# Patient Record
Sex: Female | Born: 1955 | Race: Black or African American | Hispanic: No | Marital: Single | Smoking: Never smoker
Health system: Southern US, Community
[De-identification: ages and names within clinical notes are randomized; demographics above are authoritative.]

## PROBLEM LIST (undated history)

## (undated) DIAGNOSIS — M1990 Unspecified osteoarthritis, unspecified site: Secondary | ICD-10-CM

## (undated) DIAGNOSIS — E785 Hyperlipidemia, unspecified: Secondary | ICD-10-CM

## (undated) DIAGNOSIS — M545 Low back pain, unspecified: Secondary | ICD-10-CM

## (undated) DIAGNOSIS — D649 Anemia, unspecified: Secondary | ICD-10-CM

## (undated) DIAGNOSIS — E669 Obesity, unspecified: Secondary | ICD-10-CM

## (undated) DIAGNOSIS — E119 Type 2 diabetes mellitus without complications: Secondary | ICD-10-CM

## (undated) DIAGNOSIS — M7989 Other specified soft tissue disorders: Secondary | ICD-10-CM

## (undated) DIAGNOSIS — T7840XA Allergy, unspecified, initial encounter: Secondary | ICD-10-CM

## (undated) HISTORY — DX: Low back pain, unspecified: M54.50

## (undated) HISTORY — DX: Allergy, unspecified, initial encounter: T78.40XA

## (undated) HISTORY — PX: BREAST SURGERY: SHX581

## (undated) HISTORY — DX: Unspecified osteoarthritis, unspecified site: M19.90

## (undated) HISTORY — PX: ABDOMINAL HYSTERECTOMY: SHX81

## (undated) HISTORY — PX: MYOMECTOMY: SHX85

## (undated) HISTORY — DX: Obesity, unspecified: E66.9

## (undated) HISTORY — DX: Hyperlipidemia, unspecified: E78.5

## (undated) HISTORY — DX: Low back pain: M54.5

## (undated) HISTORY — DX: Anemia, unspecified: D64.9

---

## 1998-10-06 ENCOUNTER — Ambulatory Visit (HOSPITAL_COMMUNITY): Admission: RE | Admit: 1998-10-06 | Discharge: 1998-10-06 | Payer: Self-pay | Admitting: Obstetrics and Gynecology

## 1999-11-14 ENCOUNTER — Other Ambulatory Visit: Admission: RE | Admit: 1999-11-14 | Discharge: 1999-11-14 | Payer: Self-pay | Admitting: Obstetrics and Gynecology

## 1999-11-20 ENCOUNTER — Encounter (INDEPENDENT_AMBULATORY_CARE_PROVIDER_SITE_OTHER): Payer: Self-pay | Admitting: Specialist

## 1999-11-20 ENCOUNTER — Ambulatory Visit (HOSPITAL_COMMUNITY): Admission: RE | Admit: 1999-11-20 | Discharge: 1999-11-20 | Payer: Self-pay | Admitting: Obstetrics and Gynecology

## 2000-04-15 ENCOUNTER — Encounter: Admission: RE | Admit: 2000-04-15 | Discharge: 2000-04-15 | Payer: Self-pay | Admitting: Family Medicine

## 2000-04-15 ENCOUNTER — Encounter: Payer: Self-pay | Admitting: Family Medicine

## 2000-10-13 ENCOUNTER — Other Ambulatory Visit: Admission: RE | Admit: 2000-10-13 | Discharge: 2000-10-13 | Payer: Self-pay | Admitting: Obstetrics and Gynecology

## 2000-10-24 ENCOUNTER — Ambulatory Visit (HOSPITAL_COMMUNITY): Admission: RE | Admit: 2000-10-24 | Discharge: 2000-10-24 | Payer: Self-pay | Admitting: Obstetrics and Gynecology

## 2000-10-24 ENCOUNTER — Encounter: Payer: Self-pay | Admitting: Obstetrics and Gynecology

## 2001-04-20 ENCOUNTER — Encounter: Payer: Self-pay | Admitting: Specialist

## 2001-04-20 ENCOUNTER — Encounter: Admission: RE | Admit: 2001-04-20 | Discharge: 2001-04-20 | Payer: Self-pay | Admitting: Specialist

## 2002-01-05 ENCOUNTER — Other Ambulatory Visit: Admission: RE | Admit: 2002-01-05 | Discharge: 2002-01-05 | Payer: Self-pay | Admitting: Obstetrics and Gynecology

## 2002-02-26 ENCOUNTER — Encounter (INDEPENDENT_AMBULATORY_CARE_PROVIDER_SITE_OTHER): Payer: Self-pay | Admitting: Specialist

## 2002-02-26 ENCOUNTER — Ambulatory Visit (HOSPITAL_COMMUNITY): Admission: RE | Admit: 2002-02-26 | Discharge: 2002-02-26 | Payer: Self-pay | Admitting: *Deleted

## 2003-04-01 ENCOUNTER — Other Ambulatory Visit: Admission: RE | Admit: 2003-04-01 | Discharge: 2003-04-01 | Payer: Self-pay | Admitting: Obstetrics and Gynecology

## 2003-05-11 ENCOUNTER — Encounter: Admission: RE | Admit: 2003-05-11 | Discharge: 2003-05-11 | Payer: Self-pay | Admitting: Family Medicine

## 2003-05-11 ENCOUNTER — Encounter: Payer: Self-pay | Admitting: Family Medicine

## 2004-09-28 ENCOUNTER — Other Ambulatory Visit: Admission: RE | Admit: 2004-09-28 | Discharge: 2004-09-28 | Payer: Self-pay | Admitting: Obstetrics and Gynecology

## 2008-04-07 ENCOUNTER — Encounter (INDEPENDENT_AMBULATORY_CARE_PROVIDER_SITE_OTHER): Payer: Self-pay | Admitting: Obstetrics and Gynecology

## 2008-04-07 ENCOUNTER — Inpatient Hospital Stay (HOSPITAL_COMMUNITY): Admission: RE | Admit: 2008-04-07 | Discharge: 2008-04-10 | Payer: Self-pay | Admitting: Obstetrics and Gynecology

## 2008-04-13 ENCOUNTER — Inpatient Hospital Stay (HOSPITAL_COMMUNITY): Admission: AD | Admit: 2008-04-13 | Discharge: 2008-04-13 | Payer: Self-pay | Admitting: Obstetrics and Gynecology

## 2008-05-19 ENCOUNTER — Emergency Department (HOSPITAL_COMMUNITY): Admission: EM | Admit: 2008-05-19 | Discharge: 2008-05-19 | Payer: Self-pay | Admitting: Emergency Medicine

## 2008-10-26 ENCOUNTER — Encounter: Admission: RE | Admit: 2008-10-26 | Discharge: 2008-10-26 | Payer: Self-pay | Admitting: Family Medicine

## 2008-12-20 ENCOUNTER — Encounter: Payer: Self-pay | Admitting: Pulmonary Disease

## 2009-02-07 ENCOUNTER — Ambulatory Visit: Payer: Self-pay | Admitting: Pulmonary Disease

## 2009-02-07 ENCOUNTER — Telehealth (INDEPENDENT_AMBULATORY_CARE_PROVIDER_SITE_OTHER): Payer: Self-pay | Admitting: *Deleted

## 2009-02-07 DIAGNOSIS — J309 Allergic rhinitis, unspecified: Secondary | ICD-10-CM | POA: Insufficient documentation

## 2009-02-07 DIAGNOSIS — R059 Cough, unspecified: Secondary | ICD-10-CM | POA: Insufficient documentation

## 2009-02-07 DIAGNOSIS — R05 Cough: Secondary | ICD-10-CM

## 2009-03-03 ENCOUNTER — Ambulatory Visit: Payer: Self-pay | Admitting: Pulmonary Disease

## 2010-03-13 ENCOUNTER — Ambulatory Visit: Payer: Self-pay | Admitting: Internal Medicine

## 2010-10-05 LAB — HM COLONOSCOPY

## 2010-12-25 NOTE — H&P (Signed)
NAMEJAYDEEN, Barbara [name]            ACCOUNT NO.:  [id number]   MEDICAL RECORD NO.:  [id number]          PATIENT TYPE:  AMB   LOCATION:  SDC                           FACILITY:  [facility]   PHYSICIAN:  Dineen Kid. Rana Snare, M.D.    DATE OF BIRTH:  [birth year]   DATE OF ADMISSION:  DATE OF DISCHARGE:                              HISTORY & PHYSICAL   HISTORY OF PRESENT ILLNESS:  Barbara [name] is a 55 year old, G4, P-0  black female with worsening problems with fibroids.  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  She desires definitive surgical intervention and  presents for hysterectomy.  She has not had children, so we are going to  attempt a laparoscopic-assisted hysterectomy, but understands that she  may need an abdominal hysterectomy.   PAST MEDICAL HISTORY:  Significant for:  1. Anemia.  2. Fibroids.   PAST SURGICAL HISTORY:  1. Myomectomy.  2. Also 7 miscarriages requiring D&Cs.   MEDICATIONS:  She is currently on birth control pills.   She is allergic to Sulfa.   PHYSICAL EXAM:  Her blood pressure is 120/70, her heart is regular rate  and rhythm.  LUNGS:  Clear to auscultation bilaterally.  ABDOMEN:  Nondistended, nontender.  Well-healed incision.  PELVIC EXAM:  The uterus is anteverted, mobile with a moderate amount of  descensus.  No significant cystocele, rectocele is noted.  The uterus is  8 weeks in size, globular.  An ultrasound from February 2009 shows a  7.65 x 4.2 x 5.8 cm uterus with multiple fibroids, the largest measuring  2 cm, at least 10 of these 2-cm fibroids were measured; however, she  does have normal appearing ovaries.   IMPRESSION AND PLAN:  Menorrhagia, dysmenorrhea, multiple fibroids,  intermittent pelvic pain.   I had a lengthy discussion with Barbara [name]  with regards to myomectomy  versus hysterectomy, abdominal versus laparoscopically.  She desires to  attempt an laparoscopically-assisted vaginal hysterectomy. She desires  preservation of the ovaries.  She understands that if it cannot be done  this way safely that we will proceed with a total abdominal  hysterectomy. I discussed the surgery at length which have risks that  include but not limited to risk of infection, bleeding, damage to the  ureters, bladder, tubes, ovaries, risk associated with anesthesia and  with blood transfusion. I  also reviewed her postoperative care and  expectations.      Dineen Kid Rana Snare, M.D.  Electronically Signed     DCL/MEDQ  D:  04/06/2008  T:  04/06/2008  Job:  [id number]

## 2010-12-25 NOTE — Op Note (Signed)
NAMEJAMALA, [name]            ACCOUNT NO.:  [id number]   MEDICAL RECORD NO.:  [id number]          PATIENT TYPE:  INP   LOCATION:  9315                          [facility]ACILITY:  [facility]   PHYSICIAN:  Dineen Kid. Rana Snare, M.D.    DATE OF BIRTH:  [birth year]   DATE OF PROCEDURE:  04/07/2008  DATE OF DISCHARGE:                               OPERATIVE REPORT   PREOPERATIVE DIAGNOSES:  1. Menorrhagia.  2. Dysmenorrhea.  3. Fibroids.  4. Anemia.   POSTOPERATIVE DIAGNOSES:  1. Menorrhagia.  2. Dysmenorrhea.  3. Fibroids.  4. Anemia.  5. Pelvic adhesions.   PROCEDURES:  1. Diagnostic laparoscopy with total abdominal hysterectomy.  2. Lysis of adhesions.   SURGEON:  Dineen Kid. Rana Snare, MD   ASSISTANT:  Zelphia Cairo, MD   INDICATIONS:  Barbara [name] is a 55 year old G4, P0 with worsening  problems with menorrhagia, dysmenorrhea, pelvic pain with known  fibroids, and previous myomectomy.  She desires definitive surgical  intervention and requests hysterectomy and we are going to attempt it  with laparoscopic assisted, but if unable to proceed with that, we will  convert to abdominal hysterectomy.  The risks and benefits of the  procedure were discussed at length.  Informed consent was obtained.  See  history and physical for further details.   FINDINGS:  At the time of surgery, there were extensive pelvic adhesions  from the bowel and omentum to the uterus and bilateral tubes and ovaries  and bladder.  Normal-appearing appendix and liver.   DESCRIPTION OF PROCEDURE:  After adequate analgesia, the patient was  placed in a dorsal lithotomy position.  She was sterilely prepped and  draped.  Bladder was sterilely drained.  Graves speculum was placed.  A  Hulka tenaculum was placed in the cervix.  A 1-cm infraumbilical skin  incision was then made.  A Veress needle was inserted.  The abdomen was  insufflated, dullness to percussion.  An 11-mm trocar was inserted and  the above findings were noted.   The 5-mm trocar was inserted to left in  the midline 2 fingerbreadths above the pubic symphysis under direct  visualization.  Approximately 30 minutes was spent dissecting the  adhesions from the omentum and bowel from the anterior portion of the  uterus down to the posterior portion and to the right broad ligament.  At that point, there was still an extensive amount of bowel adhered not  only to the broad ligament but also adhered to the bladder anteriorly.  At this point, it was felt that any further dissection would put her at  increased risk for bowel injury or bladder injury.  The abdomen was then  desufflated.  Trocars were removed. The infraumbilical skin incision was  closed with 0 Vicryl interrupted suture.  The fascia with 3-0 Vicryl  Rapide subcuticular suture.  The 5-mm site was closed with 3-0 Vicryl  Rapide subcuticular suture.  A Pfannenstiel skin incision was made  through the previous incision, taken down sharply to the fascia, incised  transversely, and extended superiorly and inferiorly off the bellies  rectus muscle, which were separated sharply in midline.  Peritoneum was  entered sharply and retracted laterally with an O'Connor-O'Sullivan  retractor.  The bowel was packed cephalad and the above findings were  again noted.  The Hulka tenaculum had previously been removed from the  cervix.  Lysis of adhesions was carried out using meticulous dissection  with Metzenbaums and DeBakey's removing the bowel and omentum from the  right tubo-ovarian complex, the posterior wall, and the bowel from the  bladder anteriorly that the bladder was then dissected off the anterior  surface of the uterus.  A LigaSure instrument was used to ligate across  the utero-ovarian ligaments bilaterally down across the round ligaments  bilaterally into the inferior portion of the broad ligaments and  dissected with Mayo scissors.  After the bladder had been thoroughly  dissected off the anterior  surface of the cervix, LigaSure was again  used to ligate across the uterine vessels and the cardinal ligaments.  Heaney clamp was then used to clamp across the uterosacral ligaments  bilaterally, dissected across the vagina and the cervix, and uterus were  removed completely.  The vagina was then closed with figure-of-eight of  0 Monocryl suture.  The uterosacral ligaments were then plicated in the  midline with good support of vagina noted.  A McCall culdoplasty was  then performed to the cul-de-sac and the posterior peritoneum.  Copious  amount of irrigation was applied, and after adequate hemostasis was  assured, reexamination of the pedicles revealed good hemostasis and no  obvious injury to the bowel, bladder, or ureters.  The packing was then  removed.  The retractor was then removed.  The muscles were then  plicated in midline with figure-of-eights of 0 Monocryl suture.  After  copious amount of irrigation and adequate hemostasis was assured, the  fascia was then closed with 2 sutures of 0 PDS.  Irrigation applied.  After adequate hemostasis, skin staples and Steri-Strips applied.  The  patient tolerated the procedure well, was stable, and transferred to the  recovery room.  Sponge and instrument counts were normal x3.  Estimated  blood loss 200 mL.  The patient received 1 g of cefotetan  preoperatively.      Dineen Kid Rana Snare, M.D.  Electronically Signed     DCL/MEDQ  D:  04/07/2008  T:  04/08/2008  Job:  [id number]

## 2010-12-28 NOTE — Op Note (Signed)
[facility]  Patient:    Barbara [name], Barbara [name] Visit Number: [id number] MRN: [id number]          Service Type: DSU Location: [facility] Attending Physician:  Trevor Iha Dictated by:   Trevor Iha, M.D. Proc. Date: 02/26/02 Admit Date:  02/26/2002 Discharge Date: 02/26/2002                             Operative Report  PREOPERATIVE DIAGNOSES:       Missed abortion approximately 6-1/2 week estimated gestational age.  POSTOPERATIVE DIAGNOSES:      Missed abortion approximately 6-1/2 week estimated gestational age.  PROCEDURE:                    Dilatation and evacuation.  SURGEON:                      Trevor Iha, M.D.  ANESTHESIA:                   Monitored anesthetic care and paracervical block.  ESTIMATED BLOOD LOSS:         20 cc.  INDICATIONS:                  [name] is a 55 year old G4, P0, A3 who presented to the office for follow-up ultrasound for spotting and cramping. Ultrasound showed a 6-1/2 week fetal demise consistent with a missed abortion. 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  She presents today for dilatation and evacuation.  Plan on sending the tissue for chromosomal studies.  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  Risks and benefits were discussed at length.  Informed consent was obtained.  The patients blood type is O+.  DESCRIPTION OF PROCEDURE:     After adequate analgesia the patient is placed in the dorsal lithotomy position.  She is sterilely prepped and draped.  The bladder is sterilely drained.  Paracervical block was placed with 1% Xylocaine with 1:100,000 epinephrine.  Tenaculum was placed in the anterior lip of the cervix.  The uterus sounded to 11 cm and easily dilated to a #27 Hegar dilator.  A 7 mm suction curette was inserted.  Products of conception were retrieved.  This was performed until the endometrial cavity was emptied  from the products of conception as was evidenced by a gritty surface throughout the endometrial cavity by palpation with a suction curette.  The patient received Methergine 0.2 mg IM with good uterine response.  The curette was then removed.  The tenaculum was removed from the anterior lip of the cervix and noted to be hemostatic.  Specimen was then removed.  The patient was transferred to the recovery room in good condition.  Sponge, needle, and instrument count was normal x3.  DISPOSITION:                  The patient was discharged home.  Will follow up in the office in two to three weeks.  She was sent home with a routine instruction sheet for D&C, prescriptions for doxycycline and Methergine. Dictated by:   Trevor Iha, M.D. Attending Physician:  Trevor Iha DD:  02/26/02 TD:  03/03/02 Job: [id number]

## 2010-12-28 NOTE — Discharge Summary (Signed)
Barbara [name], Barbara [name]            ACCOUNT NO.:  [id number]   MEDICAL RECORD NO.:  [id number]          PATIENT TYPE:  INP   LOCATION:  9315                          [facility]ACILITY:  [facility]   PHYSICIAN:  Dineen Kid. Rana Snare, M.D.    DATE OF BIRTH:  09-29-1955   DATE OF ADMISSION:  04/07/2008  DATE OF DISCHARGE:  04/10/2008                               DISCHARGE SUMMARY   HISTORY OF PRESENT ILLNESS:  Ms. Liles is a 55 year old G4, P0 black  female worsening problems with fibroids.  She has had pain on and off  again with irregular and heavy bleeding.  She had an myomectomy in the  past.  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  She does  desire definitive surgical intervention and presents for hysterectomy.  She has not had children, but we are going attempt laparoscopic-assisted  vaginal hysterectomy, but understands that she may require an abdominal  hysterectomy.   HOSPITAL COURSE:  The patient underwent a diagnostic laparoscopy  followed by abdominal hysterectomy with lysis of adhesions.  The surgery  was complicated by extensive pelvic adhesions; however, the procedure  did go well with estimated blood loss of 200 mL.  Her postoperative care  was unremarkable.  She had a postoperative hemoglobin of 9.2 and by  postop day #1, she was able to ambulate and had normal active bowel  sounds.  By postoperative day #3, she was tolerating a regular diet,  ambulating without difficulty and well managed on oral pain medication  and was discharged home.  Staples were removed.  Incision was clean,  dry, and intact and the patient was discharged to home in good condition  with prescription for Percocet #30.   DISPOSITION:  The patient will follow up in the office in 1-2 weeks.  I  told her to return for increased pain, fever, or bleeding.  She was  given a prescription for oxycodone # 30.      Dineen Kid Rana Snare, M.D.  Electronically Signed     DCL/MEDQ  D:  06/04/2008  T:  06/04/2008  Job:  [id number]

## 2010-12-28 NOTE — Op Note (Signed)
[facility]  Patient:    Barbara [name], Barbara [name]                   MRN: [id number] Adm. Date:  [id number] Attending:  Trevor Iha                           Operative Report  PREOPERATIVE DIAGNOSIS:       Intrauterine pregnancy with missed abortion.  POSTOPERATIVE DIAGNOSIS:      Intrauterine pregnancy with missed abortion approximately 8 weeks size.  PROCEDURE:                    Dilation and evacuation.  SURGEON:                      Trevor Iha, M.D.  ANESTHESIA:                   Monitored anesthetic care and paracervical block.  ESTIMATED BLOOD LOSS:         50 cc.  INDICATIONS:                  [name] is a 55 year old G3, P0 at approximately 10 weeks estimated gestational age with an ultrasound consistent with a 7 1/2 week embryonic demise or missed abortion.  She presents today for dilation and evacuation.  Informed consent were obtained after risks and benefits were discussed.  Blood type is O+.  SPECIMEN:                     Products of conception were sent for chromosomal analysis.  DESCRIPTION OF PROCEDURE:     After adequate analgesia the patient was placed in the dorsal lithotomy position.  She was sterilely prepped and draped.  The bladder was slowly drained.  Gray speculum was placed.  The tenaculum was placed in the  anterior lip of the cervix and the paracervical block was placed with 1% Xylocaine and injected at 5 and 7 oclock.  The uterus sounded to 9 cm.  Easily dilated to a number 31 Hegar dilator.  A 9 mm suction curette was inserted.  The products of  conception were retrieved.  This was performed until the endometrial cavity was  felt to be emptied followed be gentle sharp curettage and a second pass of the suction curette revealing a ______ surface throughout the entire endometrium. t this time the curette was removed.  Patient received Methergine 0.2 mg IM and Toradol 30 mg IV.  The tenaculum  was removed from the anterior lip of the cervix and was noted to be hemostatic.  Speculum was then removed.  Patient tolerated procedure well was stable with transfer to recovery room.  Sponge, needle, and instrument count was normal x 3.  Estimated blood loss was less than 50 cc.  DISPOSITION:                  Patient to be discharged home with follow-up in the office in three weeks.  She is set home with a routine instruction sheet for D&C and a prescription for Doxycycline 100 mg p.o. b.i.d. x 7 days and Methergine 0.2 mg to take every eight hours for two days. DD:  11/20/99 TD:  11/20/99 Job: [id number]

## 2011-01-28 ENCOUNTER — Ambulatory Visit: Payer: Self-pay | Admitting: Internal Medicine

## 2011-01-31 ENCOUNTER — Ambulatory Visit (INDEPENDENT_AMBULATORY_CARE_PROVIDER_SITE_OTHER): Payer: 59 | Admitting: Internal Medicine

## 2011-01-31 ENCOUNTER — Encounter: Payer: Self-pay | Admitting: Internal Medicine

## 2011-01-31 VITALS — BP 138/98 | HR 72 | Temp 98.6°F | Ht 66.0 in | Wt 237.0 lb

## 2011-01-31 DIAGNOSIS — R209 Unspecified disturbances of skin sensation: Secondary | ICD-10-CM

## 2011-01-31 DIAGNOSIS — R202 Paresthesia of skin: Secondary | ICD-10-CM

## 2011-01-31 NOTE — Progress Notes (Signed)
  Subjective:    Patient ID: Barbara [name], female    DOB: 03-Mar-1956, 55 y.o.   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 Has noticed some left parascapular pain at times with shooting sensation radiating down into her left arm. However she is right-handed and fires a pistol with her right hand as well. Does some typing on the computer with both pains. Does not recall any trauma to the shoulder or neck area or to the left hand.    Review of Systems no history of serious illnesses or chronic problems aside from left knee osteoarthritis and some low back pain for which she takes Mobic. History of allergic rhinitis for which she takes Zyrtec. DTaP vaccine given 2011. Hysterectomy 2009 and breast reduction surgery done in 1990 myomectomy done around 1996     Objective:   Physical Exam has significant trigger point medial to left scapula. Deep tendon reflexes in the left upper extremity are 2+ and symmetrical muscle strength in the left upper cavity is normal. Numbness both sides of left fifth finger. Says sometimes the sensation returned briefly. Wears watch on left arm  but it is not tight.        Assessment & Plan:  Possible left upper extremity radiculopathy. 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 Plan is to do an MRI of the C-spine to look for impingement. Plan is to treat with a Sterapred DS 10 mg 6 day Dose pack.

## 2011-01-31 NOTE — Patient Instructions (Addendum)
Have MRI of C-spine. Stop Mobic while taking Staeroids. Take 6 day tapering course as directed of Prednisone. Return on 2 weeks. Your bllod pressure is elevated at 130/90. You may return any Wednesday for free blolod pressure check

## 2011-02-08 ENCOUNTER — Other Ambulatory Visit: Payer: Self-pay | Admitting: Internal Medicine

## 2011-02-08 DIAGNOSIS — M5410 Radiculopathy, site unspecified: Secondary | ICD-10-CM

## 2011-02-08 DIAGNOSIS — M542 Cervicalgia: Secondary | ICD-10-CM

## 2011-02-11 ENCOUNTER — Other Ambulatory Visit: Payer: 59

## 2011-02-14 ENCOUNTER — Ambulatory Visit
Admission: RE | Admit: 2011-02-14 | Discharge: 2011-02-14 | Disposition: A | Discharge status: Home / Self Care | Payer: 59 | Source: Ambulatory Visit | Attending: Internal Medicine | Admitting: Internal Medicine

## 2011-02-14 DIAGNOSIS — M542 Cervicalgia: Secondary | ICD-10-CM

## 2011-02-14 DIAGNOSIS — M5410 Radiculopathy, site unspecified: Secondary | ICD-10-CM

## 2011-02-15 ENCOUNTER — Ambulatory Visit: Payer: 59 | Admitting: Internal Medicine

## 2011-02-18 ENCOUNTER — Telehealth: Payer: Self-pay | Admitting: *Deleted

## 2011-02-18 NOTE — Telephone Encounter (Signed)
Pt requesting that Dr. Lenord Fellers call her regarding upcoming appointment with Dr. Franky Macho.

## 2011-02-18 NOTE — Telephone Encounter (Signed)
I left voice mail message for pt to call me here at office at apprx 4:30pm today to discuss her concerns

## 2011-05-14 LAB — POCT CARDIAC MARKERS
CKMB, poc: <1 — ABNORMAL LOW
Myoglobin, poc: 91.7
Troponin i, poc: <0.05

## 2011-05-14 LAB — GLUCOSE, CAPILLARY: Glucose-Capillary: 119 — ABNORMAL HIGH

## 2011-07-16 ENCOUNTER — Ambulatory Visit (INDEPENDENT_AMBULATORY_CARE_PROVIDER_SITE_OTHER): Payer: 59 | Admitting: Internal Medicine

## 2011-07-16 ENCOUNTER — Encounter: Payer: Self-pay | Admitting: Internal Medicine

## 2011-07-16 VITALS — BP 130/90 | HR 80 | Temp 97.8°F | Wt 231.0 lb

## 2011-07-16 DIAGNOSIS — R7309 Other abnormal glucose: Secondary | ICD-10-CM

## 2011-07-16 DIAGNOSIS — Z131 Encounter for screening for diabetes mellitus: Secondary | ICD-10-CM

## 2011-07-16 DIAGNOSIS — R7302 Impaired glucose tolerance (oral): Secondary | ICD-10-CM

## 2011-07-16 DIAGNOSIS — R350 Frequency of micturition: Secondary | ICD-10-CM

## 2011-07-16 DIAGNOSIS — IMO0001 Reserved for inherently not codable concepts without codable children: Secondary | ICD-10-CM

## 2011-07-16 DIAGNOSIS — N3941 Urge incontinence: Secondary | ICD-10-CM

## 2011-07-16 LAB — POCT URINALYSIS DIPSTICK
Bilirubin, UA: NEGATIVE
Ketones, UA: NEGATIVE
Leukocytes, UA: NEGATIVE
Nitrite, UA: NEGATIVE
pH, UA: 7.5

## 2011-07-16 LAB — HEMOGLOBIN A1C: Hgb A1c MFr Bld: 6.2 % — ABNORMAL HIGH (ref <5.7)

## 2011-07-17 DIAGNOSIS — N3941 Urge incontinence: Secondary | ICD-10-CM | POA: Insufficient documentation

## 2011-07-17 NOTE — Progress Notes (Signed)
  Subjective:    Patient ID: Barbara [name], female    DOB: [birth year], 55 y.o.   MRN: [id number]  HPI 55 year old black female employed by [employer] who for about 11 months  has had issues with urge urinary incontinence. Also will feel the need to urinate and then has to go right back to the bathroom to urinate again. We measured post void residual today and it was 30 cc. Seems to have nocturia several times nightly. No significant dysuria. She is worried about diabetes.    Review of Systems     Objective:   Physical Exam no CVA tenderness; chest clear; cardiac exam regular rate and rhythm; extremities without edema, urinalysis is normal without glucosuria or evidence of infection        Assessment & Plan:  Urge urinary incontinence  30 cc post void residual  Nocturia  Plan: Hemoglobin A1c checked and is 6.2%. Patient needs to watch diet and lose weight and reassess in 4-6 months. Try Ditropan 5 mg twice daily. Treat for possible urethral rhinitis with doxycycline 100 mg twice daily for 10 days.

## 2011-07-17 NOTE — Patient Instructions (Signed)
Take Ditropan twice daily. Take doxycycline for 10 days. You have impaired glucose tolerance. Watch your diet and try to lose weight. Return in 4 months for followup.

## 2011-07-31 ENCOUNTER — Telehealth: Payer: Self-pay

## 2011-07-31 NOTE — Telephone Encounter (Signed)
Patient informed of labs - hgb a1c 6.2%, Touch of Diabetes.informed she needs to watch her diet and lose weight. She has an appointment in January 2013.

## 2011-09-02 ENCOUNTER — Other Ambulatory Visit: Payer: 59 | Admitting: Internal Medicine

## 2011-09-03 ENCOUNTER — Encounter: Payer: 59 | Admitting: Internal Medicine

## 2011-09-26 ENCOUNTER — Other Ambulatory Visit: Payer: 59 | Admitting: Internal Medicine

## 2011-09-27 ENCOUNTER — Encounter: Payer: 59 | Admitting: Internal Medicine

## 2011-11-14 ENCOUNTER — Other Ambulatory Visit: Payer: Self-pay | Admitting: Internal Medicine

## 2011-11-14 ENCOUNTER — Other Ambulatory Visit: Payer: 59 | Admitting: Internal Medicine

## 2011-11-14 DIAGNOSIS — Z0000 Encounter for general adult medical examination without abnormal findings: Secondary | ICD-10-CM

## 2011-11-14 DIAGNOSIS — R7303 Prediabetes: Secondary | ICD-10-CM

## 2011-11-14 LAB — LIPID PANEL
Cholesterol: 267 mg/dL — ABNORMAL HIGH (ref 0–200)
HDL: 50 mg/dL (ref >39)
LDL Cholesterol: 190 mg/dL — ABNORMAL HIGH (ref 0–99)
Total CHOL/HDL Ratio: 5.3 Ratio
Triglycerides: 136 mg/dL (ref <150)
VLDL: 27 mg/dL (ref 0–40)

## 2011-11-14 LAB — COMPREHENSIVE METABOLIC PANEL
ALT: 11 U/L (ref 0–35)
AST: 15 U/L (ref 0–37)
Alkaline Phosphatase: 90 U/L (ref 39–117)
Creat: 0.82 mg/dL (ref 0.50–1.10)
Sodium: 138 mEq/L (ref 135–145)
Total Bilirubin: 0.4 mg/dL (ref 0.3–1.2)
Total Protein: 7.3 g/dL (ref 6.0–8.3)

## 2011-11-14 LAB — CBC WITH DIFFERENTIAL/PLATELET
Basophils Absolute: 0 10*3/uL (ref 0.0–0.1)
Basophils Relative: 0 % (ref 0–1)
Eosinophils Absolute: 0.3 10*3/uL (ref 0.0–0.7)
Eosinophils Relative: 4 % (ref 0–5)
Lymphs Abs: 2.6 10*3/uL (ref 0.7–4.0)
MCH: 23 pg — ABNORMAL LOW (ref 26.0–34.0)
MCV: 70.5 fL — ABNORMAL LOW (ref 78.0–100.0)
Neutrophils Relative %: 51 % (ref 43–77)
Platelets: 344 10*3/uL (ref 150–400)
RBC: 5.18 MIL/uL — ABNORMAL HIGH (ref 3.87–5.11)
RDW: 15.6 % — ABNORMAL HIGH (ref 11.5–15.5)

## 2011-11-15 ENCOUNTER — Ambulatory Visit (INDEPENDENT_AMBULATORY_CARE_PROVIDER_SITE_OTHER): Payer: 59 | Admitting: Internal Medicine

## 2011-11-15 ENCOUNTER — Encounter: Payer: Self-pay | Admitting: Internal Medicine

## 2011-11-15 VITALS — BP 126/88 | HR 92 | Temp 97.7°F | Ht 66.0 in | Wt 232.0 lb

## 2011-11-15 DIAGNOSIS — Z0000 Encounter for general adult medical examination without abnormal findings: Secondary | ICD-10-CM

## 2011-11-15 LAB — IRON AND TIBC
%SAT: 20 % (ref 20–55)
Iron: 68 ug/dL (ref 42–145)
TIBC: 332 ug/dL (ref 250–470)

## 2011-11-15 LAB — POCT URINALYSIS DIPSTICK
Ketones, UA: NEGATIVE
Protein, UA: NEGATIVE
Spec Grav, UA: 1.025

## 2011-11-15 LAB — HEMOGLOBIN A1C: Hgb A1c MFr Bld: 6.4 % — ABNORMAL HIGH (ref <5.7)

## 2011-11-16 LAB — URINALYSIS, ROUTINE W REFLEX MICROSCOPIC
Bilirubin Urine: NEGATIVE
Glucose, UA: NEGATIVE mg/dL
Hgb urine dipstick: NEGATIVE
Leukocytes, UA: NEGATIVE
pH: 6 (ref 5.0–8.0)

## 2011-11-16 LAB — URINALYSIS, MICROSCOPIC ONLY: Squamous Epithelial / LPF: NONE SEEN

## 2011-12-08 NOTE — Progress Notes (Signed)
  Subjective:    Patient ID: Barbara [name], female    DOB: [birth year], 56 y.o.   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 GYN is Dr. Rana Snare. Patient is allergic to sulfa. Patient takes Zyrtec daily. Hysterectomy without oophorectomy for fibroids 2009, breast reduction surgery 1990, myomectomy 1996. Patient has had 2 pregnancies and 2 miscarriages. Tetanus immunization given in 2011. Patient does not smoke or consume alcohol. Patient resides alone. Has a college degree. 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 Just retired last year from the [employer]. Had colonoscopy done at [facility] by Dr. Madilyn Fireman 10/05/2010. Diverticulosis was noted. No polyps identified. Patient had nerve conduction studies October 2008 for right hand tingling. Study was normal. No evidence of carpal tunnel syndrome. Saw Dr. Mikal Plane July 2012 for neck pain and numbness in left fifth finger. MRI showed small broad-based osteophyte C3-C4, moderate facet joint degenerative changes C4-C5, market right sided and moderate left-sided foraminal narrowing C5-C6. Mild spinal stenosis and mild cord flattening C4-C5. Patient was treated conservatively and got better.  Family history: Father living with history of asthma, mother living with history of CVA and hypertension. One brother and 2 sisters in good health.    Review of Systems  Constitutional: Negative.   HENT: Negative.   Respiratory: Negative.   Cardiovascular: Negative.   Gastrointestinal: Negative.   Genitourinary: Negative.   Musculoskeletal: Negative.   Neurological: Negative.   Hematological: Negative.   Psychiatric/Behavioral: Negative.        Objective:   Physical Exam  Vitals reviewed. Constitutional: She is oriented to person, place,  and time. She appears well-developed and well-nourished. No distress.  HENT:  Head: Normocephalic and atraumatic.  Right Ear: External ear normal.  Left Ear: External ear normal.  Mouth/Throat: Oropharynx is clear and moist.  Eyes: Conjunctivae and EOM are normal. Right eye exhibits no discharge. Left eye exhibits no discharge. No scleral icterus.  Neck: Neck supple. No JVD present. No thyromegaly present.  Cardiovascular: Normal rate, regular rhythm, normal heart sounds and intact distal pulses.   No murmur heard. Pulmonary/Chest: Effort normal and breath sounds normal. She has no wheezes. She has no rales.  Abdominal: Soft. Bowel sounds are normal. She exhibits no distension and no mass. There is no tenderness. There is no rebound and no guarding.  Genitourinary:       Deferred to GYN physician  Musculoskeletal: She exhibits no edema.  Lymphadenopathy:    She has no cervical adenopathy.  Neurological: She is alert and oriented to person, place, and time. No cranial nerve deficit. Coordination normal.  Skin: Skin is warm and dry. No rash noted. She is not diaphoretic.  Psychiatric: She has a normal mood and affect. Her behavior is normal. Judgment and thought content normal.          Assessment & Plan:  Allergic rhinitis  Impaired glucose tolerance  Osteoarthritis  History of cervical disc disease  History of low back pain  Plan: Return in 6 months for him an A1c and office visit. Diet and exercise.

## 2011-12-13 ENCOUNTER — Encounter: Payer: Self-pay | Admitting: Internal Medicine

## 2011-12-13 NOTE — Patient Instructions (Addendum)
Continue same medications and return in 6 months for office visit hemoglobin A1c. Encouraged diet and exercise.

## 2011-12-25 ENCOUNTER — Ambulatory Visit: Payer: 59 | Admitting: *Deleted

## 2012-01-09 ENCOUNTER — Encounter: Payer: 59 | Attending: Internal Medicine | Admitting: *Deleted

## 2012-01-09 ENCOUNTER — Encounter: Payer: Self-pay | Admitting: *Deleted

## 2012-01-09 VITALS — Ht 66.0 in | Wt 226.8 lb

## 2012-01-09 DIAGNOSIS — R7302 Impaired glucose tolerance (oral): Secondary | ICD-10-CM

## 2012-01-09 DIAGNOSIS — E785 Hyperlipidemia, unspecified: Secondary | ICD-10-CM | POA: Insufficient documentation

## 2012-01-09 DIAGNOSIS — Z713 Dietary counseling and surveillance: Secondary | ICD-10-CM | POA: Insufficient documentation

## 2012-01-09 DIAGNOSIS — E669 Obesity, unspecified: Secondary | ICD-10-CM | POA: Insufficient documentation

## 2012-01-09 DIAGNOSIS — R7309 Other abnormal glucose: Secondary | ICD-10-CM | POA: Insufficient documentation

## 2012-01-09 NOTE — Patient Instructions (Addendum)
Goals:  Eat 3 meals/day, Avoid meal skipping   Follow "Plate Method" for portion control  Choose more whole grains, lean protein, low-fat dairy, and fruits/non-starchy vegetables.   Aim for >30 min of physical activity daily  Limit sugar-sweetened beverages and concentrated sweets  Purchase calorie king book and use while eating out  Aim for 45 g of carbohydrate per meal and 15 g per snack, if hungry  Limit saturated and trans fats  Increase unsaturated fats

## 2012-01-09 NOTE — Progress Notes (Signed)
  Medical Nutrition Therapy:  Appt start time: 0915 end time:  1015.   Assessment:  Primary concerns today: obesity, hypercholesterolemia, hyperglycemia.   MEDICATIONS: see list   DIETARY INTAKE:  Usual eating pattern includes 2 meals and 1-2 snacks per day.  Work at [employer] part time  24-hr recall:  B ( AM): mostly skips; eats sometimes on weekends  Snk ( AM): none. May eat apple  L ( PM): sometimes takes from home:bologna and cheese with mayo sandwich; chips with green tea and water; chick fil a: fried chicken sandwich with carrot and raisin salad; half tea and half lemonade Snk ( PM): none, sometimes has candy bar at work D ( PM): baked chicken with potatoes and onions or sweet potato or salad; outback takeout.  Chicken wings or pizza, pasta dishes Snk ( PM): ice cream Beverages: green tea and water, some diet soda  Usual physical activity: none right now, but motivated to start  Estimated energy needs: 1600 calories 12 g carbohydrates 120 g protein 44 g fat  Progress Towards Goal(s):  In progress.   Nutritional Diagnosis:  NB-1.1 Food and nutrition-related knowledge deficit As related to dietary fats and carbohydrate containing foods.  As evidenced by obesity, hypercholesterolemia, and hyperglycemia.    Intervention:  Nutrition counseling provided.  Patient is motivated to make healthy lifestyle changes.  Is pre-diabetic and has high cholesterol.  She is mostly inactive, but motivated to start exercising.  Skips some meals, but knows better.  Discussed MyPlate, carb counting, and heart-healthy eating recommendations.  Also discussed reading food labels.  Handouts given during visit include: Heart Healthy eating Carb Counting and Food Label handouts Meal Plan Card  Plan:  Eat 3 meals/day, Avoid meal skipping   Follow "Plate Method" for portion control  Choose more whole grains, lean protein, low-fat dairy, and fruits/non-starchy vegetables.   Aim for >30 min of  physical activity daily  Limit sugar-sweetened beverages and concentrated sweets  Purchase calorie king book and use while eating out  Aim for 45 g of carbohydrate per meal and 15 g per snack, if hungry  Limit saturated and trans fats  Increase unsaturated fats  Monitoring/Evaluation:  Dietary intake, exercise, and body weight prn.  Patient will call to make follow-up appointment

## 2012-02-18 ENCOUNTER — Other Ambulatory Visit: Payer: 59 | Admitting: Internal Medicine

## 2012-02-18 DIAGNOSIS — R7303 Prediabetes: Secondary | ICD-10-CM

## 2012-02-18 DIAGNOSIS — E559 Vitamin D deficiency, unspecified: Secondary | ICD-10-CM

## 2012-02-18 DIAGNOSIS — E785 Hyperlipidemia, unspecified: Secondary | ICD-10-CM

## 2012-02-18 DIAGNOSIS — Z79899 Other long term (current) drug therapy: Secondary | ICD-10-CM

## 2012-02-18 LAB — HEPATIC FUNCTION PANEL
ALT: 11 U/L (ref 0–35)
Albumin: 4.1 g/dL (ref 3.5–5.2)
Total Protein: 6.6 g/dL (ref 6.0–8.3)

## 2012-02-18 LAB — LIPID PANEL
Cholesterol: 222 mg/dL — ABNORMAL HIGH (ref 0–200)
Total CHOL/HDL Ratio: 5 Ratio
Triglycerides: 105 mg/dL (ref <150)
VLDL: 21 mg/dL (ref 0–40)

## 2012-02-18 LAB — HEMOGLOBIN A1C
Hgb A1c MFr Bld: 6 % — ABNORMAL HIGH (ref <5.7)
Mean Plasma Glucose: 126 mg/dL — ABNORMAL HIGH (ref <117)

## 2012-02-20 ENCOUNTER — Ambulatory Visit: Payer: 59 | Admitting: Internal Medicine

## 2012-02-24 ENCOUNTER — Ambulatory Visit: Payer: 59 | Admitting: Internal Medicine

## 2012-02-27 ENCOUNTER — Ambulatory Visit: Payer: 59 | Admitting: Internal Medicine

## 2012-02-28 ENCOUNTER — Ambulatory Visit: Payer: 59 | Admitting: Internal Medicine

## 2012-03-06 ENCOUNTER — Ambulatory Visit (INDEPENDENT_AMBULATORY_CARE_PROVIDER_SITE_OTHER): Payer: 59 | Admitting: Internal Medicine

## 2012-03-06 VITALS — BP 136/88 | HR 68 | Temp 98.6°F | Wt 226.0 lb

## 2012-03-06 DIAGNOSIS — E119 Type 2 diabetes mellitus without complications: Secondary | ICD-10-CM

## 2012-03-06 DIAGNOSIS — E785 Hyperlipidemia, unspecified: Secondary | ICD-10-CM

## 2012-03-06 DIAGNOSIS — E8881 Metabolic syndrome: Secondary | ICD-10-CM

## 2012-03-06 DIAGNOSIS — E669 Obesity, unspecified: Secondary | ICD-10-CM

## 2012-04-13 ENCOUNTER — Encounter: Payer: Self-pay | Admitting: Internal Medicine

## 2012-04-13 DIAGNOSIS — E8881 Metabolic syndrome: Secondary | ICD-10-CM | POA: Insufficient documentation

## 2012-04-13 DIAGNOSIS — E785 Hyperlipidemia, unspecified: Secondary | ICD-10-CM | POA: Insufficient documentation

## 2012-04-13 DIAGNOSIS — E669 Obesity, unspecified: Secondary | ICD-10-CM | POA: Insufficient documentation

## 2012-04-13 DIAGNOSIS — E119 Type 2 diabetes mellitus without complications: Secondary | ICD-10-CM | POA: Insufficient documentation

## 2012-04-13 NOTE — Progress Notes (Signed)
  Subjective:    Patient ID: Barbara [name], female    DOB: [birth year], 56 y.o.   MRN: [id number]  HPI 56 year old black female in today for followup on type 2 diabetes mellitus and hyperlipidemia. 6 months ago, her hemoglobin A1c was elevated at 6.4%. She's worked on this and now has it down to 6% which is very good. She went to see a nutritionist. She's been trying to get more exercise. Her lipids have improved 2. Previously total cholesterol was 267 and is now down to 222. LDL cholesterol has improved from 190-157. She probably should be on statin medication but she has been reluctant to do that. She has a history of allergic rhinitis and takes Zyrtec 10 mg daily. Takes Mobic for musculoskeletal pain. I am pleased with her progress in these areas but we still haven't always to go. Would like to see her lose at least 20 pounds.    Review of Systems     Objective:   Physical Exam neck is supple without thyromegaly or carotid bruits; chest clear to auscultation; cardiac exam regular rate and rhythm normal S1 and S2; extremities without edema. Diabetic foot exam: Pulses are normal in feet. No evidence of tinea pedis or ulcers.   Obesity          Assessment & Plan:  Type 2 diabetes mellitus with significant improvement in hemoglobin A1c after seeing nutritionist  Hyperlipidemia-probably needs to be on statin medication with history of glucose impairment the patient has been reluctant to do so  Obesity  Metabolic syndrome  Borderline hypertension-continue to monitor. Probably should be on low dose ACE inhibitor with history of diabetes  Plan: Reassess in 4 to 6 months

## 2012-04-13 NOTE — Patient Instructions (Addendum)
Continue diet and exercise and try to lose weight. Reassess in 4-6 months

## 2012-06-11 ENCOUNTER — Other Ambulatory Visit: Payer: 59 | Admitting: Internal Medicine

## 2012-06-12 ENCOUNTER — Ambulatory Visit: Payer: 59 | Admitting: Internal Medicine

## 2013-03-05 ENCOUNTER — Other Ambulatory Visit: Payer: 59 | Admitting: Internal Medicine

## 2013-03-08 ENCOUNTER — Encounter: Payer: 59 | Admitting: Internal Medicine

## 2013-04-19 ENCOUNTER — Other Ambulatory Visit: Payer: 59 | Admitting: Internal Medicine

## 2013-04-19 ENCOUNTER — Other Ambulatory Visit: Payer: Self-pay | Admitting: Internal Medicine

## 2013-04-19 DIAGNOSIS — E785 Hyperlipidemia, unspecified: Secondary | ICD-10-CM

## 2013-04-19 DIAGNOSIS — E119 Type 2 diabetes mellitus without complications: Secondary | ICD-10-CM

## 2013-04-19 DIAGNOSIS — Z0000 Encounter for general adult medical examination without abnormal findings: Secondary | ICD-10-CM

## 2013-04-19 DIAGNOSIS — Z130 Encounter for screening for diseases of the blood and blood-forming organs and certain disorders involving the immune mechanism: Secondary | ICD-10-CM

## 2013-04-19 DIAGNOSIS — Z1329 Encounter for screening for other suspected endocrine disorder: Secondary | ICD-10-CM

## 2013-04-19 LAB — CBC WITH DIFFERENTIAL/PLATELET
Basophils Relative: 0 % (ref 0–1)
Eosinophils Absolute: 0.3 10*3/uL (ref 0.0–0.7)
MCH: 22.2 pg — ABNORMAL LOW (ref 26.0–34.0)
MCHC: 31.5 g/dL (ref 30.0–36.0)
Monocytes Relative: 9 % (ref 3–12)
Neutrophils Relative %: 53 % (ref 43–77)
Platelets: 342 10*3/uL (ref 150–400)
RDW: 17.1 % — ABNORMAL HIGH (ref 11.5–15.5)

## 2013-04-19 LAB — HEMOGLOBIN A1C: Hgb A1c MFr Bld: 6.3 % — ABNORMAL HIGH (ref <5.7)

## 2013-04-20 ENCOUNTER — Ambulatory Visit (INDEPENDENT_AMBULATORY_CARE_PROVIDER_SITE_OTHER): Payer: 59 | Admitting: Internal Medicine

## 2013-04-20 ENCOUNTER — Encounter: Payer: Self-pay | Admitting: Internal Medicine

## 2013-04-20 VITALS — BP 112/90 | HR 76 | Ht 65.0 in | Wt 235.0 lb

## 2013-04-20 DIAGNOSIS — E669 Obesity, unspecified: Secondary | ICD-10-CM

## 2013-04-20 DIAGNOSIS — Z0000 Encounter for general adult medical examination without abnormal findings: Secondary | ICD-10-CM

## 2013-04-20 DIAGNOSIS — R718 Other abnormality of red blood cells: Secondary | ICD-10-CM

## 2013-04-20 DIAGNOSIS — E119 Type 2 diabetes mellitus without complications: Secondary | ICD-10-CM

## 2013-04-20 DIAGNOSIS — J309 Allergic rhinitis, unspecified: Secondary | ICD-10-CM

## 2013-04-20 DIAGNOSIS — E8881 Metabolic syndrome: Secondary | ICD-10-CM

## 2013-04-20 DIAGNOSIS — E785 Hyperlipidemia, unspecified: Secondary | ICD-10-CM

## 2013-04-20 LAB — COMPREHENSIVE METABOLIC PANEL
Alkaline Phosphatase: 79 U/L (ref 39–117)
Glucose, Bld: 86 mg/dL (ref 70–99)
Sodium: 138 mEq/L (ref 135–145)
Total Bilirubin: 0.5 mg/dL (ref 0.3–1.2)
Total Protein: 6.8 g/dL (ref 6.0–8.3)

## 2013-04-20 LAB — IRON AND TIBC
%SAT: 24 % (ref 20–55)
Iron: 75 ug/dL (ref 42–145)
TIBC: 309 ug/dL (ref 250–470)

## 2013-04-20 LAB — POCT URINALYSIS DIPSTICK
Bilirubin, UA: NEGATIVE
Glucose, UA: NEGATIVE
Spec Grav, UA: 1.025

## 2013-04-20 LAB — LIPID PANEL
LDL Cholesterol: 186 mg/dL — ABNORMAL HIGH (ref 0–99)
Triglycerides: 110 mg/dL (ref <150)
VLDL: 22 mg/dL (ref 0–40)

## 2013-04-20 MED ORDER — ERGOCALCIFEROL 1.25 MG (50000 UT) PO CAPS: 50000.0000 [IU] | ORAL_CAPSULE | ORAL | Status: DC

## 2013-04-20 MED ORDER — METFORMIN HCL 500 MG PO TABS: 500.0000 mg | ORAL_TABLET | Freq: Two times a day (BID) | ORAL | Status: DC

## 2013-04-20 MED ORDER — SIMVASTATIN 10 MG PO TABS: 10.0000 mg | ORAL_TABLET | Freq: Every day | ORAL | Status: DC

## 2013-04-20 NOTE — Progress Notes (Signed)
Subjective:    Patient ID: Barbara [name], female    DOB: May 03, 1956, 57 y.o.   MRN: [id number]  HPI 57 year old black female with history of type 2 diabetes mellitus, hyperlipidemia, obesity, metabolic syndrome, allergic rhinitis, urge urinary incontinence for health maintenance exam and evaluation of medical issues. Patient currently taking Zyrtec and Mobic only. She had diabetic eye exam by Dr. Hazle Quant 4 months ago. Blood pressure is normal.  Past medical history: Patient is allergic to sulfa. She had hysterectomy without oophorectomy for fibroids in 2009. Had breast reduction surgery 1990. Had myomectomy 1996. Patient has had 2 pregnancies and 2 miscarriages. She had colonoscopy done by [facility] February 2012. Diverticulosis was noted. No polyps identified. Patient had nerve conduction studies October 2008 for right hand tingling. Study was normal. No evidence of carpal tunnel syndrome. She saw Dr. Rema Jasmine in July 2012 for neck pain and numbness in left fifth finger. MRI showed a small broad-based osteophytes C3-C4 with moderate facet joint degenerative changes C4-C5, Marked right-sided and moderate left-sided foraminal narrowing C5-C6. Had mild spinal stenosis and mild cord flattening C4-C5. Patient was treated conservatively and improved.  Social history: Patient does not smoke or consume alcohol. She resides alone. She has a college degree. 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 She is now retired from the [employer].  Family history: Father living with history of asthma. Mother living with history of CVA and hypertension. One brother and 2 sisters in good health.    Review of Systems  HENT: Negative.   Respiratory: Negative.   Endocrine: Negative.   Genitourinary: Negative.   Allergic/Immunologic: Positive for environmental allergies.  Neurological: Negative.   Hematological: Negative.   Psychiatric/Behavioral:  Negative.   All other systems reviewed and are negative.       Objective:   Physical Exam  Vitals reviewed. Constitutional: She is oriented to person, place, and time. She appears well-developed and well-nourished. No distress.  HENT:  Head: Normocephalic and atraumatic.  Right Ear: External ear normal.  Left Ear: External ear normal.  Mouth/Throat: Oropharynx is clear and moist. No oropharyngeal exudate.  Eyes: Conjunctivae and EOM are normal. Pupils are equal, round, and reactive to light. Right eye exhibits no discharge. Left eye exhibits no discharge. No scleral icterus.  Neck: Neck supple. No JVD present. No thyromegaly present.  Cardiovascular: Normal rate, regular rhythm, normal heart sounds and intact distal pulses.   No murmur heard. Pulmonary/Chest: Effort normal and breath sounds normal. No respiratory distress. She has no wheezes. She has no rales. She exhibits no tenderness.  Breasts normal female  Abdominal: Soft. Bowel sounds are normal. She exhibits no distension and no mass. There is no tenderness. There is no rebound and no guarding.  Genitourinary:  Deferred to Dr. Rana Snare  Musculoskeletal: Normal range of motion. She exhibits no edema.  Lymphadenopathy:    She has no cervical adenopathy.  Neurological: She is alert and oriented to person, place, and time. She has normal reflexes. No cranial nerve deficit. Coordination normal.  Skin: Skin is warm and dry. No rash noted. She is not diaphoretic.  Psychiatric: She has a normal mood and affect. Judgment and thought content normal.          Assessment & Plan:  Type 2 diabetes mellitus  Hyperlipidemia  Obesity  Metabolic syndrome  Allergic rhinitis  History of urge urinary incontinence  Plan: Patient will start metformin 500 mg twice daily.  These take vitamin D supplementation. Start Zocor 10 mg daily. May continue Zyrtec and Mobic. Reminded regarding annual mammogram. Patient plans influenza immunization.

## 2013-04-21 LAB — URINALYSIS, ROUTINE W REFLEX MICROSCOPIC
Bilirubin Urine: NEGATIVE
Glucose, UA: NEGATIVE mg/dL
Specific Gravity, Urine: 1.024 (ref 1.005–1.030)
pH: 5.5 (ref 5.0–8.0)

## 2013-04-21 LAB — MICROALBUMIN, URINE: Microalb, Ur: 1.01 mg/dL (ref 0.00–1.89)

## 2013-07-22 ENCOUNTER — Other Ambulatory Visit: Payer: 59 | Admitting: Internal Medicine

## 2013-07-23 ENCOUNTER — Ambulatory Visit: Payer: 59 | Admitting: Internal Medicine

## 2013-08-20 ENCOUNTER — Other Ambulatory Visit: Payer: 59 | Admitting: Internal Medicine

## 2013-08-23 ENCOUNTER — Ambulatory Visit: Payer: 59 | Admitting: Internal Medicine

## 2013-09-09 ENCOUNTER — Other Ambulatory Visit: Payer: 59 | Admitting: Internal Medicine

## 2013-09-10 ENCOUNTER — Ambulatory Visit: Payer: 59 | Admitting: Internal Medicine

## 2013-09-23 ENCOUNTER — Other Ambulatory Visit: Payer: 59 | Admitting: Internal Medicine

## 2013-09-24 ENCOUNTER — Ambulatory Visit: Payer: 59 | Admitting: Internal Medicine

## 2013-10-04 NOTE — Patient Instructions (Addendum)
Encouraged diet exercise and weight loss. Start metformin 500 mg twice daily and Zocor 10 mg daily. Take vitamin D supplementation. Return in 6 months

## 2013-10-15 ENCOUNTER — Other Ambulatory Visit: Payer: 59 | Admitting: Internal Medicine

## 2013-10-18 ENCOUNTER — Ambulatory Visit: Payer: 59 | Admitting: Internal Medicine

## 2013-11-01 ENCOUNTER — Other Ambulatory Visit: Payer: 59 | Admitting: Internal Medicine

## 2013-11-02 ENCOUNTER — Ambulatory Visit: Payer: 59 | Admitting: Internal Medicine

## 2013-12-20 ENCOUNTER — Other Ambulatory Visit: Payer: 59 | Admitting: Internal Medicine

## 2013-12-21 ENCOUNTER — Other Ambulatory Visit: Payer: 59 | Admitting: Internal Medicine

## 2013-12-23 ENCOUNTER — Ambulatory Visit: Payer: 59 | Admitting: Internal Medicine

## 2014-01-18 DIAGNOSIS — M1712 Unilateral primary osteoarthritis, left knee: Secondary | ICD-10-CM | POA: Insufficient documentation

## 2014-01-30 ENCOUNTER — Ambulatory Visit (INDEPENDENT_AMBULATORY_CARE_PROVIDER_SITE_OTHER): Payer: 59 | Admitting: Family Medicine

## 2014-01-30 ENCOUNTER — Ambulatory Visit (INDEPENDENT_AMBULATORY_CARE_PROVIDER_SITE_OTHER): Payer: 59

## 2014-01-30 VITALS — BP 134/88 | HR 75 | Temp 98.1°F | Resp 16 | Ht 66.0 in | Wt 235.0 lb

## 2014-01-30 DIAGNOSIS — M79609 Pain in unspecified limb: Secondary | ICD-10-CM

## 2014-01-30 DIAGNOSIS — M79644 Pain in right finger(s): Secondary | ICD-10-CM

## 2014-01-30 DIAGNOSIS — S6000XA Contusion of unspecified finger without damage to nail, initial encounter: Secondary | ICD-10-CM

## 2014-01-30 DIAGNOSIS — S60031A Contusion of right middle finger without damage to nail, initial encounter: Secondary | ICD-10-CM
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
## 2014-01-30 NOTE — Patient Instructions (Signed)
1. Take Aleve 2-3 times per daily for the next 5 days. 2.  Ice hand twice daily for 15-20 minutes each time.

## 2014-02-07 ENCOUNTER — Telehealth: Payer: Self-pay

## 2014-02-07 NOTE — Telephone Encounter (Signed)
Pt would like to pick up her xrays, will be here at 8:00 tonight

## 2014-02-08 ENCOUNTER — Encounter: Payer: Self-pay | Admitting: Internal Medicine

## 2014-02-08 ENCOUNTER — Ambulatory Visit (INDEPENDENT_AMBULATORY_CARE_PROVIDER_SITE_OTHER): Payer: 59 | Admitting: Internal Medicine

## 2014-02-08 VITALS — BP 122/84 | Temp 98.3°F | Wt 238.0 lb

## 2014-02-08 DIAGNOSIS — M542 Cervicalgia: Secondary | ICD-10-CM

## 2014-02-08 DIAGNOSIS — M79641 Pain in right hand: Secondary | ICD-10-CM

## 2014-02-08 DIAGNOSIS — M79609 Pain in unspecified limb: Secondary | ICD-10-CM

## 2014-02-08 MED ORDER — CYCLOBENZAPRINE HCL 10 MG PO TABS: 10.0000 mg | ORAL_TABLET | Freq: Every day | ORAL | Status: DC

## 2014-02-08 MED ORDER — IBUPROFEN 600 MG PO TABS: 600.0000 mg | ORAL_TABLET | Freq: Three times a day (TID) | ORAL | Status: DC
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
## 2014-02-08 NOTE — Patient Instructions (Addendum)
Take ibuprofen 600 mg 3 times daily. Ice neck for 20 minutes twice daily. Take Flexeril for muscle spasm in neck. Have MRI done to assess possible hand injury or occult fracture. Have C-spine films done. Return in 2 weeks.

## 2014-02-08 NOTE — Telephone Encounter (Signed)
Xray copying disc and will contact pt when ready.

## 2014-02-09 ENCOUNTER — Ambulatory Visit
Admission: RE | Admit: 2014-02-09 | Discharge: 2014-02-09 | Disposition: A | Discharge status: Home / Self Care | Payer: 59 | Source: Ambulatory Visit | Attending: Internal Medicine | Admitting: Internal Medicine

## 2014-02-09 DIAGNOSIS — M542 Cervicalgia: Secondary | ICD-10-CM

## 2014-02-16 ENCOUNTER — Telehealth: Payer: Self-pay | Admitting: Internal Medicine

## 2014-02-16 NOTE — Telephone Encounter (Signed)
Refer to Dr. Merlyn LotKuzma, hand surgeon appt February 17, 2014 at 1:30 pm Pt aware. Notes faxed. Pt to take copy of X-ray with her to appt.

## 2014-02-20 ENCOUNTER — Other Ambulatory Visit: Payer: 59

## 2014-02-22 ENCOUNTER — Other Ambulatory Visit: Payer: 59

## 2014-03-12 ENCOUNTER — Emergency Department (HOSPITAL_COMMUNITY)
Admission: EM | Admit: 2014-03-12 | Discharge: 2014-03-12 | Disposition: A | Discharge status: Home / Self Care | Payer: 59 | Source: Home / Self Care | Attending: Emergency Medicine | Admitting: Emergency Medicine

## 2014-03-12 ENCOUNTER — Encounter (HOSPITAL_COMMUNITY): Payer: Self-pay | Admitting: Emergency Medicine

## 2014-03-12 ENCOUNTER — Emergency Department (INDEPENDENT_AMBULATORY_CARE_PROVIDER_SITE_OTHER): Payer: 59

## 2014-03-12 DIAGNOSIS — M25561 Pain in right knee: Secondary | ICD-10-CM

## 2014-03-12 DIAGNOSIS — M7989 Other specified soft tissue disorders: Secondary | ICD-10-CM

## 2014-03-12 DIAGNOSIS — M25569 Pain in unspecified knee: Secondary | ICD-10-CM

## 2014-03-12 LAB — D-DIMER, QUANTITATIVE (NOT AT ARMC): D DIMER QUANT: 0.68 ug{FEU}/mL — AB (ref 0.00–0.48)

## 2014-03-12 MED ORDER — ENOXAPARIN SODIUM 150 MG/ML ~~LOC~~ SOLN
150.0000 mg | Freq: Once | SUBCUTANEOUS | Status: AC
  Administered 2014-03-12: 150 mg via SUBCUTANEOUS
  Filled 2014-03-12: qty 1

## 2014-03-12 MED ORDER — IBUPROFEN 600 MG PO TABS
600.0000 mg | ORAL_TABLET | Freq: Three times a day (TID) | ORAL | Status: DC
Start: 2014-03-12 — End: 2015-01-12

## 2014-03-12 MED ORDER — HYDROCODONE-ACETAMINOPHEN 5-325 MG PO TABS: 1.0000 | ORAL_TABLET | Freq: Four times a day (QID) | ORAL | Status: DC | PRN

## 2014-03-12 MED ORDER — ENOXAPARIN SODIUM 150 MG/ML ~~LOC~~ SOLN: 150.0000 mg | Freq: Once | SUBCUTANEOUS | Status: DC

## 2014-03-12 NOTE — ED Notes (Signed)
Voice mail left at vascular lab [phone] for dopplers of the right leg tomorrow morning, per protocol.  Mw,cma
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
## 2014-03-12 NOTE — ED Notes (Signed)
C/o  Right knee pain that radiates down leg and at back of knee   X 5 days.   No relief with otc pain meds.

## 2014-03-12 NOTE — Discharge Instructions (Signed)
I am worried that you might have a blood clot in your right leg. We gave you a shot of a blood thinner today.  This covers you for 24 hours. You have an appointment tomorrow to get an ultrasound to find out if there is a clot in your leg.  Take ibuprofen 600mg  3 times a day for pain. Use Norco every 6 hours as needed for severe pain.  You will follow up at the Urgent Care tomorrow after your ultrasound. Please make an appointment to see your orthopedic doctor for additional evaluation of your knee.

## 2014-03-13 ENCOUNTER — Ambulatory Visit (HOSPITAL_COMMUNITY)
Admission: RE | Admit: 2014-03-13 | Discharge: 2014-03-13 | Disposition: A | Discharge status: Home / Self Care | Payer: 59 | Source: Ambulatory Visit | Attending: Emergency Medicine | Admitting: Emergency Medicine

## 2014-03-13 DIAGNOSIS — M7989 Other specified soft tissue disorders: Secondary | ICD-10-CM | POA: Insufficient documentation

## 2014-03-13 DIAGNOSIS — M79609 Pain in unspecified limb: Secondary | ICD-10-CM | POA: Diagnosis not present

## 2014-03-13 NOTE — Progress Notes (Signed)
VASCULAR LAB PRELIMINARY  PRELIMINARY  PRELIMINARY  PRELIMINARY  Right lower extremity venous Doppler completed.    Preliminary report:  There is no DVT or SVT noted in the right lower extremity.   Tabor Denham, RVT 03/13/2014, 8:25 AM

## 2014-03-22 DIAGNOSIS — Z0271 Encounter for disability determination: Secondary | ICD-10-CM

## 2014-03-31 ENCOUNTER — Other Ambulatory Visit: Payer: Self-pay | Admitting: Orthopedic Surgery

## 2014-03-31 DIAGNOSIS — T1490XA Injury, unspecified, initial encounter: Secondary | ICD-10-CM

## 2014-03-31 DIAGNOSIS — M79644 Pain in right finger(s): Secondary | ICD-10-CM

## 2014-04-11 ENCOUNTER — Ambulatory Visit
Admission: RE | Admit: 2014-04-11 | Discharge: 2014-04-11 | Disposition: A | Discharge status: Home / Self Care | Payer: 59 | Source: Ambulatory Visit | Attending: Orthopedic Surgery | Admitting: Orthopedic Surgery

## 2014-04-11 DIAGNOSIS — T1490XA Injury, unspecified, initial encounter: Secondary | ICD-10-CM

## 2014-04-11 DIAGNOSIS — M79644 Pain in right finger(s): Secondary | ICD-10-CM

## 2014-04-11 MED ORDER — IOHEXOL 180 MG/ML  SOLN
1.0000 mL | Freq: Once | INTRAMUSCULAR | Status: AC | PRN
  Administered 2014-04-11: 1 mL via INTRA_ARTICULAR

## 2014-04-14 ENCOUNTER — Other Ambulatory Visit: Payer: Self-pay | Admitting: Orthopedic Surgery

## 2014-05-03 ENCOUNTER — Encounter: Payer: Self-pay | Admitting: Internal Medicine

## 2014-05-03 ENCOUNTER — Ambulatory Visit (INDEPENDENT_AMBULATORY_CARE_PROVIDER_SITE_OTHER): Payer: 59 | Admitting: Internal Medicine

## 2014-05-03 VITALS — BP 118/78 | HR 68 | Wt 240.0 lb

## 2014-05-03 DIAGNOSIS — R609 Edema, unspecified: Secondary | ICD-10-CM

## 2014-05-03 DIAGNOSIS — E119 Type 2 diabetes mellitus without complications: Secondary | ICD-10-CM

## 2014-05-03 DIAGNOSIS — M7989 Other specified soft tissue disorders: Secondary | ICD-10-CM

## 2014-05-03 MED ORDER — FUROSEMIDE 20 MG PO TABS
20.0000 mg | ORAL_TABLET | Freq: Every day | ORAL | Status: DC
Start: 2014-05-03 — End: 2014-07-27

## 2014-05-04 ENCOUNTER — Telehealth: Payer: Self-pay

## 2014-05-04 ENCOUNTER — Ambulatory Visit (HOSPITAL_COMMUNITY)
Admission: RE | Admit: 2014-05-04 | Discharge: 2014-05-04 | Disposition: A | Discharge status: Home / Self Care | Payer: 59 | Source: Ambulatory Visit | Attending: Internal Medicine | Admitting: Internal Medicine

## 2014-05-04 DIAGNOSIS — M79609 Pain in unspecified limb: Secondary | ICD-10-CM | POA: Insufficient documentation

## 2014-05-04 DIAGNOSIS — M7989 Other specified soft tissue disorders: Secondary | ICD-10-CM

## 2014-05-04 LAB — HEMOGLOBIN A1C
HEMOGLOBIN A1C: 6.2 % — AB (ref <5.7)
MEAN PLASMA GLUCOSE: 131 mg/dL — AB (ref <117)

## 2014-05-04 NOTE — Progress Notes (Signed)
*  Preliminary Results* Right lower extremity venous duplex completed. Right lower extremity is negative for deep vein thrombosis. There is no evidence of right Baker's cyst.  05/04/2014 11:56 AM  Gertie Fey, RVT, RDCS, RDMS

## 2014-05-04 NOTE — Telephone Encounter (Signed)
Call report for Doppler done today.  Negative for DVT.

## 2014-05-04 NOTE — Telephone Encounter (Signed)
Patient informed of A1C results and negative doppler results.  Appointments made to discuss diabetes.

## 2014-05-04 NOTE — Telephone Encounter (Signed)
Please let pt know plus Hgb AIC results. She is to return next week and I will discuss diabetic management with her.

## 2014-05-05 ENCOUNTER — Ambulatory Visit: Payer: Self-pay | Admitting: Internal Medicine

## 2014-05-05 ENCOUNTER — Telehealth: Payer: Self-pay | Admitting: Internal Medicine

## 2014-05-05 NOTE — Telephone Encounter (Signed)
Left message for patient to call office regarding her leg pain.

## 2014-05-05 NOTE — Telephone Encounter (Signed)
Pt needs to stay off feet and take diuretic for leg swelling. Watch salt consumption. No clot in leg. This will take several days to improve. See next week.

## 2014-05-05 NOTE — Telephone Encounter (Signed)
Pt called and is having pain and stiffness on back of right leg "where knee bends" and wanted to know if there was anything she could do to help with the discomfort.  Please advise.  Best number to call pt is [phone]

## 2014-05-06 NOTE — Telephone Encounter (Signed)
Spoke with patient.  Advised her to stay off her feet.  She says she is off work today.  She will call next week if does not improve.

## 2014-05-09 ENCOUNTER — Other Ambulatory Visit: Payer: 59 | Admitting: Internal Medicine

## 2014-05-09 ENCOUNTER — Encounter: Payer: Self-pay | Admitting: Internal Medicine

## 2014-05-09 DIAGNOSIS — R609 Edema, unspecified: Secondary | ICD-10-CM

## 2014-05-10 ENCOUNTER — Telehealth: Payer: Self-pay

## 2014-05-10 LAB — POTASSIUM: POTASSIUM: 4.3 meq/L (ref 3.5–5.3)

## 2014-05-10 NOTE — Telephone Encounter (Signed)
Patient informed of lab results.  She says the swelling in her leg has gone down and if feels better.

## 2014-05-10 NOTE — Telephone Encounter (Signed)
Message copied by Judd GaudierLEVENS, [name] on Tue May 10, 2014 12:21 PM ------      Message from: [name], [name]      Created: Tue May 10, 2014 11:24 AM       Call patient. Potassium is normal. Has leg swelling improved? ------

## 2014-05-11 ENCOUNTER — Encounter (HOSPITAL_BASED_OUTPATIENT_CLINIC_OR_DEPARTMENT_OTHER): Payer: Self-pay | Admitting: *Deleted

## 2014-05-11 NOTE — Patient Instructions (Signed)
Take diuretic  for lower extremity edema. Have Doppler study to rule out DVT. Return next week for potassium to be checked given diuretic therapy. Stay off feet as much as possible
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
## 2014-05-23 ENCOUNTER — Telehealth: Payer: Self-pay | Admitting: Internal Medicine

## 2014-05-23 ENCOUNTER — Encounter (HOSPITAL_BASED_OUTPATIENT_CLINIC_OR_DEPARTMENT_OTHER): Payer: Self-pay | Admitting: *Deleted

## 2014-05-23 DIAGNOSIS — M7989 Other specified soft tissue disorders: Secondary | ICD-10-CM

## 2014-05-23 NOTE — Telephone Encounter (Signed)
Patient referred to cardiology.  Appt with cardiology 06/15/2014 1130 at [address]  Advised her to keep feet elevated and she can call them to see if any cancellations.

## 2014-05-23 NOTE — Telephone Encounter (Signed)
Must then keep legs  Elevated  If cannot take fluid pill.  We will send her to Cardiologist. Please arrange.

## 2014-05-23 NOTE — Telephone Encounter (Signed)
Still c/o R leg swelling.  States she hasn't been on her feet too much.  Says that it starts in her knees and it start's tightening and then it moves down.  Then her feet get tight.  She's having surgery tomorrow on her hand and states her legs are swollen.  She took fluid pill yesterday, but was told to not take them today or tomorrow with surgery.

## 2014-05-24 ENCOUNTER — Encounter (HOSPITAL_BASED_OUTPATIENT_CLINIC_OR_DEPARTMENT_OTHER): Payer: Self-pay | Admitting: Anesthesiology

## 2014-05-24 ENCOUNTER — Encounter (HOSPITAL_BASED_OUTPATIENT_CLINIC_OR_DEPARTMENT_OTHER): Payer: 59 | Admitting: Anesthesiology

## 2014-05-24 ENCOUNTER — Ambulatory Visit (HOSPITAL_BASED_OUTPATIENT_CLINIC_OR_DEPARTMENT_OTHER)
Admission: RE | Admit: 2014-05-24 | Discharge: 2014-05-24 | Disposition: A | Discharge status: Home / Self Care | Payer: 59 | Source: Ambulatory Visit | Attending: Orthopedic Surgery | Admitting: Orthopedic Surgery

## 2014-05-24 ENCOUNTER — Ambulatory Visit (HOSPITAL_BASED_OUTPATIENT_CLINIC_OR_DEPARTMENT_OTHER): Payer: 59 | Admitting: Anesthesiology

## 2014-05-24 ENCOUNTER — Encounter (HOSPITAL_BASED_OUTPATIENT_CLINIC_OR_DEPARTMENT_OTHER)
Admission: RE | Disposition: A | Discharge status: Home / Self Care | Payer: Self-pay | Source: Ambulatory Visit | Attending: Orthopedic Surgery

## 2014-05-24 DIAGNOSIS — E119 Type 2 diabetes mellitus without complications: Secondary | ICD-10-CM | POA: Insufficient documentation

## 2014-05-24 DIAGNOSIS — M1712 Unilateral primary osteoarthritis, left knee: Secondary | ICD-10-CM | POA: Insufficient documentation

## 2014-05-24 DIAGNOSIS — Y998 Other external cause status: Secondary | ICD-10-CM | POA: Diagnosis not present

## 2014-05-24 DIAGNOSIS — E785 Hyperlipidemia, unspecified: Secondary | ICD-10-CM | POA: Diagnosis not present

## 2014-05-24 DIAGNOSIS — E669 Obesity, unspecified: Secondary | ICD-10-CM | POA: Diagnosis not present

## 2014-05-24 DIAGNOSIS — S63412A Traumatic rupture of collateral ligament of right middle finger at metacarpophalangeal and interphalangeal joint, initial encounter: Secondary | ICD-10-CM | POA: Diagnosis not present

## 2014-05-24 DIAGNOSIS — Z6838 Body mass index (BMI) 38.0-38.9, adult: Secondary | ICD-10-CM | POA: Diagnosis not present

## 2014-05-24 DIAGNOSIS — Y92414 Local residential or business street as the place of occurrence of the external cause: Secondary | ICD-10-CM | POA: Diagnosis not present

## 2014-05-24 DIAGNOSIS — Z882 Allergy status to sulfonamides status: Secondary | ICD-10-CM | POA: Diagnosis not present

## 2014-05-24 DIAGNOSIS — Y9389 Activity, other specified: Secondary | ICD-10-CM | POA: Insufficient documentation

## 2014-05-24 HISTORY — DX: Type 2 diabetes mellitus without complications: E11.9

## 2014-05-24 HISTORY — PX: LIGAMENT REPAIR: SHX5444

## 2014-05-24 HISTORY — DX: Other specified soft tissue disorders: M79.89

## 2014-05-24 LAB — POCT HEMOGLOBIN-HEMACUE: Hemoglobin: 11.4 g/dL — ABNORMAL LOW (ref 12.0–15.0)

## 2014-05-24 SURGERY — REPAIR, LIGAMENT
Anesthesia: Regional | Site: Hand | Laterality: Right

## 2014-05-24 MED ORDER — ONDANSETRON HCL 4 MG/2ML IJ SOLN
INTRAMUSCULAR | Status: DC | PRN
  Administered 2014-05-24: 4 mg via INTRAVENOUS

## 2014-05-24 MED ORDER — OXYCODONE-ACETAMINOPHEN 10-325 MG PO TABS: 1.0000 | ORAL_TABLET | ORAL | Status: DC | PRN

## 2014-05-24 MED ORDER — FENTANYL CITRATE 0.05 MG/ML IJ SOLN
INTRAMUSCULAR | Status: AC
  Filled 2014-05-24: qty 2

## 2014-05-24 MED ORDER — OXYCODONE HCL 5 MG PO TABS: 5.0000 mg | ORAL_TABLET | Freq: Once | ORAL | Status: DC | PRN

## 2014-05-24 MED ORDER — CEFAZOLIN SODIUM-DEXTROSE 2-3 GM-% IV SOLR
2.0000 g | INTRAVENOUS | Status: AC
  Administered 2014-05-24: 2 g via INTRAVENOUS

## 2014-05-24 MED ORDER — PROPOFOL 10 MG/ML IV BOLUS
INTRAVENOUS | Status: DC | PRN
  Administered 2014-05-24: 180 mg via INTRAVENOUS

## 2014-05-24 MED ORDER — CHLORHEXIDINE GLUCONATE 4 % EX LIQD: 60.0000 mL | Freq: Once | CUTANEOUS | Status: DC

## 2014-05-24 MED ORDER — MIDAZOLAM HCL 2 MG/2ML IJ SOLN
1.0000 mg | INTRAMUSCULAR | Status: DC | PRN
  Administered 2014-05-24: 2 mg via INTRAVENOUS

## 2014-05-24 MED ORDER — ONDANSETRON HCL 4 MG/2ML IJ SOLN: 4.0000 mg | Freq: Four times a day (QID) | INTRAMUSCULAR | Status: DC | PRN

## 2014-05-24 MED ORDER — MIDAZOLAM HCL 2 MG/2ML IJ SOLN
INTRAMUSCULAR | Status: AC
  Filled 2014-05-24: qty 2

## 2014-05-24 MED ORDER — FENTANYL CITRATE 0.05 MG/ML IJ SOLN
50.0000 ug | INTRAMUSCULAR | Status: DC | PRN
  Administered 2014-05-24: 100 ug via INTRAVENOUS

## 2014-05-24 MED ORDER — OXYCODONE HCL 5 MG/5ML PO SOLN: 5.0000 mg | Freq: Once | ORAL | Status: DC | PRN

## 2014-05-24 MED ORDER — CEFAZOLIN SODIUM-DEXTROSE 2-3 GM-% IV SOLR: 2.0000 g | INTRAVENOUS | Status: DC

## 2014-05-24 MED ORDER — EPHEDRINE SULFATE 50 MG/ML IJ SOLN
INTRAMUSCULAR | Status: DC | PRN
  Administered 2014-05-24: 5 mg via INTRAVENOUS

## 2014-05-24 MED ORDER — BUPIVACAINE-EPINEPHRINE (PF) 0.5% -1:200000 IJ SOLN
INTRAMUSCULAR | Status: DC | PRN
  Administered 2014-05-24: 30 mL via PERINEURAL

## 2014-05-24 MED ORDER — PROPOFOL 10 MG/ML IV BOLUS
INTRAVENOUS | Status: AC
  Filled 2014-05-24: qty 100

## 2014-05-24 MED ORDER — LIDOCAINE HCL (CARDIAC) 20 MG/ML IV SOLN
INTRAVENOUS | Status: DC | PRN
  Administered 2014-05-24: 80 mg via INTRAVENOUS

## 2014-05-24 MED ORDER — CEFAZOLIN SODIUM-DEXTROSE 2-3 GM-% IV SOLR
INTRAVENOUS | Status: AC
  Filled 2014-05-24: qty 50

## 2014-05-24 MED ORDER — BUPIVACAINE HCL (PF) 0.25 % IJ SOLN
INTRAMUSCULAR | Status: AC
  Filled 2014-05-24: qty 30

## 2014-05-24 MED ORDER — FENTANYL CITRATE 0.05 MG/ML IJ SOLN: 25.0000 ug | INTRAMUSCULAR | Status: DC | PRN

## 2014-05-24 MED ORDER — DEXAMETHASONE SODIUM PHOSPHATE 10 MG/ML IJ SOLN
INTRAMUSCULAR | Status: DC | PRN
  Administered 2014-05-24: 4 mg via INTRAVENOUS

## 2014-05-24 MED ORDER — LACTATED RINGERS IV SOLN
INTRAVENOUS | Status: DC
  Administered 2014-05-24 (×2): via INTRAVENOUS

## 2014-05-24 SURGICAL SUPPLY — 70 items
ANCHOR JUGGERKNOT 1.0 3-0 NLD (Anchor) ×2 IMPLANT
BLADE MINI RND TIP GREEN BEAV (BLADE) ×2 IMPLANT
BLADE SURG 15 STRL LF DISP TIS (BLADE) ×1 IMPLANT
BLADE SURG 15 STRL SS (BLADE) ×1
BNDG COHESIVE 3X5 TAN STRL LF (GAUZE/BANDAGES/DRESSINGS) ×2 IMPLANT
BNDG ESMARK 4X9 LF (GAUZE/BANDAGES/DRESSINGS) ×2 IMPLANT
BNDG GAUZE ELAST 4 BULKY (GAUZE/BANDAGES/DRESSINGS) ×2 IMPLANT
CATH IV 18G (IV SOLUTION) IMPLANT
CHLORAPREP W/TINT 26ML (MISCELLANEOUS) ×2 IMPLANT
CORDS BIPOLAR (ELECTRODE) ×2 IMPLANT
COVER BACK TABLE 60X90IN (DRAPES) ×2 IMPLANT
COVER MAYO STAND STRL (DRAPES) ×2 IMPLANT
CUFF TOURNIQUET SINGLE 18IN (TOURNIQUET CUFF) IMPLANT
CUFF TOURNIQUET SINGLE 24IN (TOURNIQUET CUFF) ×2 IMPLANT
DECANTER SPIKE VIAL GLASS SM (MISCELLANEOUS) IMPLANT
DRAPE EXTREMITY TIBURON (DRAPES) ×2 IMPLANT
DRAPE OEC MINIVIEW 54X84 (DRAPES) IMPLANT
DRAPE SURG 17X23 STRL (DRAPES) ×2 IMPLANT
GAUZE SPONGE 4X4 12PLY STRL (GAUZE/BANDAGES/DRESSINGS) ×2 IMPLANT
GAUZE XEROFORM 1X8 LF (GAUZE/BANDAGES/DRESSINGS) ×2 IMPLANT
GLOVE BIO SURGEON STRL SZ7.5 (GLOVE) ×2 IMPLANT
GLOVE BIOGEL PI IND STRL 7.0 (GLOVE) ×1 IMPLANT
GLOVE BIOGEL PI IND STRL 8 (GLOVE) ×1 IMPLANT
GLOVE BIOGEL PI IND STRL 8.5 (GLOVE) ×1 IMPLANT
GLOVE BIOGEL PI INDICATOR 7.0 (GLOVE) ×1
GLOVE BIOGEL PI INDICATOR 8 (GLOVE) ×1
GLOVE BIOGEL PI INDICATOR 8.5 (GLOVE) ×1
GLOVE ECLIPSE 6.5 STRL STRAW (GLOVE) ×2 IMPLANT
GLOVE SURG ORTHO 8.0 STRL STRW (GLOVE) ×2 IMPLANT
GOWN STRL REUS W/ TWL LRG LVL3 (GOWN DISPOSABLE) ×1 IMPLANT
GOWN STRL REUS W/TWL LRG LVL3 (GOWN DISPOSABLE) ×1
GOWN STRL REUS W/TWL XL LVL3 (GOWN DISPOSABLE) ×4 IMPLANT
NEEDLE 27GAX1X1/2 (NEEDLE) IMPLANT
NEEDLE ADDISON D1/2 CIR (NEEDLE) IMPLANT
NEEDLE FISTULA 1/2 CIRCLE (NEEDLE) IMPLANT
NEEDLE KEITH (NEEDLE) IMPLANT
NEEDLE KEITH SZ10 STRAIGHT (NEEDLE) IMPLANT
NS IRRIG 1000ML POUR BTL (IV SOLUTION) ×2 IMPLANT
PACK BASIN DAY SURGERY FS (CUSTOM PROCEDURE TRAY) ×2 IMPLANT
PAD CAST 3X4 CTTN HI CHSV (CAST SUPPLIES) ×1 IMPLANT
PADDING CAST ABS 4INX4YD NS (CAST SUPPLIES)
PADDING CAST ABS COTTON 4X4 ST (CAST SUPPLIES) IMPLANT
PADDING CAST COTTON 3X4 STRL (CAST SUPPLIES) ×1
PASSER SUT SWANSON 36MM LOOP (INSTRUMENTS) ×2 IMPLANT
SET EXT MALE ROTATING LL 32IN (MISCELLANEOUS) IMPLANT
SLEEVE SCD COMPRESS KNEE MED (MISCELLANEOUS) ×2 IMPLANT
SLING ARM LRG ADULT FOAM STRAP (SOFTGOODS) ×2 IMPLANT
SPLINT PLASTER CAST XFAST 3X15 (CAST SUPPLIES) ×5 IMPLANT
SPLINT PLASTER XTRA FASTSET 3X (CAST SUPPLIES) ×5
STOCKINETTE 4X48 STRL (DRAPES) ×2 IMPLANT
SUT ETHIBOND 3-0 V-5 (SUTURE) IMPLANT
SUT FIBERWIRE 2-0 18 17.9 3/8 (SUTURE)
SUT FIBERWIRE 3-0 18 TAPR NDL (SUTURE)
SUT MERSILENE 2.0 SH NDLE (SUTURE) IMPLANT
SUT MERSILENE 4 0 P 3 (SUTURE) ×2 IMPLANT
SUT MON AB 3-0 SH 27 (SUTURE)
SUT MON AB 3-0 SH27 (SUTURE) IMPLANT
SUT SILK 4 0 PS 2 (SUTURE) IMPLANT
SUT STEEL 3 0 (SUTURE) IMPLANT
SUT STEEL 4 0 (SUTURE) IMPLANT
SUT STEEL 4 0 V 26 (SUTURE) IMPLANT
SUT VIC AB 4-0 P2 18 (SUTURE) IMPLANT
SUT VICRYL 4-0 PS2 18IN ABS (SUTURE) ×2 IMPLANT
SUT VICRYL RAPIDE 4/0 PS 2 (SUTURE) ×2 IMPLANT
SUTURE FIBERWR 2-0 18 17.9 3/8 (SUTURE) IMPLANT
SUTURE FIBERWR 3-0 18 TAPR NDL (SUTURE) IMPLANT
SYR BULB 3OZ (MISCELLANEOUS) ×2 IMPLANT
SYR CONTROL 10ML LL (SYRINGE) IMPLANT
TOWEL OR 17X24 6PK STRL BLUE (TOWEL DISPOSABLE) ×2 IMPLANT
UNDERPAD 30X30 INCONTINENT (UNDERPADS AND DIAPERS) IMPLANT

## 2014-05-24 NOTE — Transfer of Care (Signed)
Immediate Anesthesia Transfer of Care Note  Patient: Barbara [name]  Procedure(s) Performed: Procedure(s):  RECONSTRUCTION RADIAL COLLATERAL LIGAMENT METACARPAL PHALANGEAL JOINT RIGHT MIDDLE FINGER WITH PALMARIS LONGUS GRAFT (Right)  Patient Location: PACU  Anesthesia Type:GA combined with regional for post-op pain  Level of Consciousness: sedated  Airway & Oxygen Therapy: Patient Spontanous Breathing and Patient connected to face mask oxygen  Post-op Assessment: Report given to PACU RN and Post -op Vital signs reviewed and stable  Post vital signs: Reviewed and stable  Complications: No apparent anesthesia complications

## 2014-05-24 NOTE — Discharge Instructions (Addendum)
[facility] Instructions °Hand Surgery ° °Wound Care: °Keep your hand elevated above the level of your heart.  Do not allow it to dangle by your side.  Keep the dressing dry and do not remove it unless your doctor advises you to do so.  He will usually change it at the time of your post-op visit.  Moving your fingers is advised to stimulate circulation but will depend on the site of your surgery.  If you have a splint applied, your doctor will advise you regarding movement. ° °Activity: °Do not drive or operate machinery today.  Rest today and then you may return to your normal activity and work as indicated by your physician. ° °Diet:  °Drink liquids today or eat a light diet.  You may resume a regular diet tomorrow.   ° °General expectations: °Pain for two to three days. °Fingers may become slightly swollen. ° °Call your doctor if any of the following occur: °Severe pain not relieved by pain medication. °Elevated temperature. °Dressing soaked with blood. °Inability to move fingers. °White or bluish color to fingers. ° ° °Post Anesthesia Home Care Instructions ° °Activity: °Get plenty of rest for the remainder of the day. A responsible adult should stay with you for 24 hours following the procedure.  °For the next 24 hours, DO NOT: °-Drive a car °-Operate machinery °-Drink alcoholic beverages °-Take any medication unless instructed by your physician °-Make any legal decisions or sign important papers. ° °Meals: °Start with liquid foods such as gelatin or soup. Progress to regular foods as tolerated. Avoid greasy, spicy, heavy foods. If nausea and/or vomiting occur, drink only clear liquids until the nausea and/or vomiting subsides. Call your physician if vomiting continues. ° °Special Instructions/Symptoms: °Your throat may feel dry or sore from the anesthesia or the breathing tube placed in your throat during surgery. If this causes discomfort, gargle with warm salt water. The discomfort should disappear within 24  hours. ° ° °Regional Anesthesia Blocks ° °1. Numbness or the inability to move the "blocked" extremity may last from 3-48 hours after placement. The length of time depends on the medication injected and your individual response to the medication. If the numbness is not going away after 48 hours, call your surgeon. ° °2. The extremity that is blocked will need to be protected until the numbness is gone and the  Strength has returned. Because you cannot feel it, you will need to take extra care to avoid injury. Because it may be weak, you may have difficulty moving it or using it. You may not know what position it is in without looking at it while the block is in effect. ° °3. For blocks in the legs and feet, returning to weight bearing and walking needs to be done carefully. You will need to wait until the numbness is entirely gone and the strength has returned. You should be able to move your leg and foot normally before you try and bear weight or walk. You will need someone to be with you when you first try to ensure you do not fall and possibly risk injury. ° °4. Bruising and tenderness at the needle site are common side effects and will resolve in a few days. ° °5. Persistent numbness or new problems with movement should be communicated to the surgeon or the [facility] ([phone])/ [facility] ([phone]). °

## 2014-05-24 NOTE — Op Note (Signed)
NAMJene Every:  Barbara [name], Barbara [name]            ACCOUNT NO.:  [id number]  MEDICAL RECORD NO.:  [id number]  LOCATION:                                 FACILITY:  PHYSICIAN:  Cindee SaltGary Momina Hunton, M.D.       DATE OF BIRTH:  16-Sep-1955  DATE OF PROCEDURE:  05/24/2014 DATE OF DISCHARGE:                              OPERATIVE REPORT   PREOPERATIVE DIAGNOSIS:  Ruptured radial collateral ligament, metacarpophalangeal joint, right middle finger.  POSTOPERATIVE DIAGNOSIS:  Ruptured radial collateral ligament, metacarpophalangeal joint, right middle finger.  OPERATION:  Reconstruction, radial collateral ligament, metacarpophalangeal joint, right middle finger with palmaris longus graft.  SURGEON:  Cindee SaltGary Sila Sarsfield, M.D.  ASSISTANT:  Barbara LoaKevin Harlei Lehrmann, MD  ANESTHESIA:  Supraclavicular block, general.  ANESTHESIOLOGIST:  Achille RichAdam Hodierne, M.D.  HISTORY:  The patient is a 58 year old female who suffered an injury to her right middle finger.  She has had pain and swelling since that time. This has not responded to conservative treatment.  MRI reveals a rupture of the radial collateral ligament of proximal phalanx of the right middle finger.  She is desirous of proceeding to have this reconstructed, repaired as necessary.  Pre, peri, postoperative course have been discussed along with risks and complications.  She is aware that there is no guarantee with the surgery; possibility of infection; recurrence of injury to arteries, nerves, tendons; incomplete relief of symptoms; dystrophy.  In the preoperative area, the patient was seen, the extremity marked by both the patient and surgeon.  Antibiotic given.  PROCEDURE IN DETAIL:  The patient was brought to the operating room where a supraclavicular block was carried out in the preoperative area and general anesthetic in the operating room under the direction of Dr. Chaney MallingHodierne.  She was prepped using ChloraPrep in supine position, right arm free.  A 3-minute dry time was allowed.   Time-out taken, confirming the patient and procedure.  The limb was exsanguinated with an Esmarch bandage.  Tourniquet placed high on the arm was inflated to 250 mmHg.  A curvilinear incision was made over the metacarpophalangeal joint of right middle finger, carried down through subcutaneous tissue.  Bleeders were electrocauterized with bipolar.  The extensor hood was incised on its radial aspect, leaving an adequate cuff to the central tendon.  The capsule was then opened.  The rupture of the collateral ligament was immediately apparent.  This was scarred to the capsule.  There was no attachment to the proximal phalanx.  The area was roughened.  The capsule isolated from the collateral ligament.  It was decided to proceed with a graft in that the collateral ligament, had shrunk, was still attached to the metacarpal, but it showed no ability to be maintained out to the length with the metacarpophalangeal joint flexed. A longitudinal incision was made over the palmaris longus tendon, carried down through subcutaneous tissue.  A radial half of the palmaris longus tendon was then harvested over approximately 4 cm.  This was then irrigated and closed.  Skin closed with interrupted 4-0 Vicryl Rapide sutures.  Retractors were placed in the metacarpophalangeal joint.  The palmaris longus, after irrigation of the joint, was passed through the proximal aspect attachment of the collateral ligament.  This was then zigzagged distally and sutured into position onto the collateral ligament.  A 1-mm juggernaut was then placed in the proximal phalanx volar aspect and the old ligament and tendon graft was then sutured to the base of the proximal phalanx.  A full flexion of the metacarpophalangeal joint was noted with good stability.  The wound was again irrigated with saline.  The capsule closed with a running 4-0 Vicryl sutures.  The extensor tendon was repaired with 4-0 Mersilene sutures in a running  overlapping nature and the skin with interrupted 4- 0 Vicryl Rapide.  A sterile compressive dressing, forearm based, with all fingers was placed on the metacarpophalangeal joint, full flexion. The patient tolerated the procedure well, was taken to the recovery room for observation in satisfactory condition.  She will be discharged home to [facility] [place] in 1 week on Percocet.          ______________________________ Cindee SaltGary Janelle Spellman, M.D.     GK/MEDQ  D:  05/24/2014  T:  05/24/2014  Job:  [id number]

## 2014-05-24 NOTE — Progress Notes (Signed)
Assisted Dr. Hodierne with right, ultrasound guided, infraclavicular block. Side rails up, monitors on throughout procedure. See vital signs in flow sheet. Tolerated Procedure well. 

## 2014-05-24 NOTE — Anesthesia Preprocedure Evaluation (Signed)
Anesthesia Evaluation  Patient identified by MRN, date of birth, ID band Patient awake    Reviewed: Allergy & Precautions, H&P , NPO status , Patient's Chart, lab work & pertinent test results  Airway Mallampati: II  Neck ROM: full    Dental   Pulmonary neg pulmonary ROS,          Cardiovascular negative cardio ROS      Neuro/Psych    GI/Hepatic   Endo/Other  diabetes, Type 2obese  Renal/GU      Musculoskeletal  (+) Arthritis -,   Abdominal   Peds  Hematology   Anesthesia Other Findings   Reproductive/Obstetrics                           Anesthesia Physical Anesthesia Plan  ASA: II  Anesthesia Plan: General and Regional   Post-op Pain Management: MAC Combined w/ Regional for Post-op pain   Induction: Intravenous  Airway Management Planned: LMA  Additional Equipment:   Intra-op Plan:   Post-operative Plan:   Informed Consent: I have reviewed the patients History and Physical, chart, labs and discussed the procedure including the risks, benefits and alternatives for the proposed anesthesia with the patient or authorized representative who has indicated his/her understanding and acceptance.     Plan Discussed with: CRNA, Anesthesiologist and Surgeon  Anesthesia Plan Comments:         Anesthesia Quick Evaluation

## 2014-05-24 NOTE — Op Note (Signed)
Dictation Number [id number]

## 2014-05-24 NOTE — Anesthesia Postprocedure Evaluation (Signed)
Anesthesia Post Note  Patient: Barbara [name]  Procedure(s) Performed: Procedure(s) (LRB):  RECONSTRUCTION RADIAL COLLATERAL LIGAMENT METACARPAL PHALANGEAL JOINT RIGHT MIDDLE FINGER WITH PALMARIS LONGUS GRAFT (Right)  Anesthesia type: General  Patient location: PACU  Post pain: Pain level controlled and Adequate analgesia  Post assessment: Post-op Vital signs reviewed, Patient's Cardiovascular Status Stable, Respiratory Function Stable, Patent Airway and Pain level controlled  Last Vitals:  Filed Vitals:   05/24/14 1035  BP: 140/75  Pulse: 67  Temp: 36.4 C  Resp: 16    Post vital signs: Reviewed and stable  Level of consciousness: awake, alert  and oriented  Complications: No apparent anesthesia complications
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
## 2014-05-24 NOTE — Brief Op Note (Signed)
05/24/2014  9:43 AM  PATIENT:  Barbara [name]  58 y.o. female  PRE-OPERATIVE DIAGNOSIS:  Rupture Radial Collateral Liagment Right Middle Finger Metacarpal Phalangeal Joint  POST-OPERATIVE DIAGNOSIS:  Rupture Radial Collateral Liagment Right Middle Finger Metacarpal Phalangeal Joint  PROCEDURE:  Procedure(s):  RECONSTRUCTION RADIAL COLLATERAL LIGAMENT METACARPAL PHALANGEAL JOINT RIGHT MIDDLE FINGER WITH PALMARIS LONGUS GRAFT (Right)  SURGEON:  Surgeon(s) and Role:    * Cindee SaltGary Mellina Benison, MD - Primary    * Betha LoaKevin Jivan Symanski, MD - Assisting  PHYSICIAN ASSISTANT:   ASSISTANTS: K Mikhael Hendriks,MD   ANESTHESIA:   regional and general  EBL:  Total I/O In: 1000 [I.V.:1000] Out: -   BLOOD ADMINISTERED:none  DRAINS: none   LOCAL MEDICATIONS USED:  NONE  SPECIMEN:  No Specimen  DISPOSITION OF SPECIMEN:  N/A  COUNTS:  YES  TOURNIQUET:   Total Tourniquet Time Documented: Upper Arm (Right) - 49 minutes Total: Upper Arm (Right) - 49 minutes   DICTATION: .Other Dictation: Dictation Number [id number]  PLAN OF CARE: Discharge to home after PACU  PATIENT DISPOSITION:  PACU - hemodynamically stable.

## 2014-05-24 NOTE — Anesthesia Procedure Notes (Addendum)
Anesthesia Regional Block:  Infraclavicular brachial plexus block  Pre-Anesthetic Checklist: ,, timeout performed, Correct Patient, Correct Site, Correct Laterality, Correct Procedure, Correct Position, site marked, Risks and benefits discussed,  Surgical consent,  Pre-op evaluation,  At surgeon's request and post-op pain management  Laterality: Right  Prep: chloraprep       Needles:  Injection technique: Single-shot  Needle Type: Echogenic Stimulator Needle     Needle Length: 9cm 9 cm Needle Gauge: 21 and 21 G    Additional Needles:  Procedures: ultrasound guided (picture in chart) and nerve stimulator Infraclavicular brachial plexus block  Nerve Stimulator or Paresthesia:  Response: biceps flexion, 0.45 mA,   Additional Responses:   Narrative:  Start time: 05/24/2014 7:56 AM End time: 05/24/2014 8:08 AM Injection made incrementally with aspirations every 5 mL.  Performed by: Personally  Anesthesiologist: Dr Chaney MallingHodierne  Additional Notes: Functioning IV was confirmed and monitors were applied.  A 90mm 21ga Arrow echogenic stimulator needle was used. Sterile prep and drape,hand hygiene and sterile gloves were used.  Negative aspiration and negative test dose prior to incremental administration of local anesthetic. The patient tolerated the procedure well.  Ultrasound guidance: relevent anatomy identified, needle position confirmed, local anesthetic spread visualized around nerve(s), vascular puncture avoided.  Image printed for medical record.    Procedure Name: LMA Insertion Date/Time: 05/24/2014 8:38 AM Performed by: Burna CashONRAD, Joan Herschberger C Pre-anesthesia Checklist: Patient identified, Emergency Drugs available, Suction available and Patient being monitored Patient Re-evaluated:Patient Re-evaluated prior to inductionOxygen Delivery Method: Circle System Utilized Preoxygenation: Pre-oxygenation with 100% oxygen Intubation Type: IV induction Ventilation: Mask ventilation  without difficulty LMA: LMA inserted LMA Size: 4.0 Number of attempts: 1 Airway Equipment and Method: bite block Placement Confirmation: positive ETCO2 Tube secured with: Tape Dental Injury: Teeth and Oropharynx as per pre-operative assessment

## 2014-05-25 ENCOUNTER — Encounter (HOSPITAL_BASED_OUTPATIENT_CLINIC_OR_DEPARTMENT_OTHER): Payer: Self-pay | Admitting: Orthopedic Surgery

## 2014-06-07 DIAGNOSIS — Z0271 Encounter for disability determination: Secondary | ICD-10-CM

## 2014-06-15 ENCOUNTER — Ambulatory Visit (INDEPENDENT_AMBULATORY_CARE_PROVIDER_SITE_OTHER): Payer: 59 | Admitting: Cardiovascular Disease

## 2014-06-15 ENCOUNTER — Encounter: Payer: Self-pay | Admitting: Cardiovascular Disease

## 2014-06-15 VITALS — BP 110/80 | HR 62 | Ht 66.0 in | Wt 238.2 lb

## 2014-06-15 DIAGNOSIS — R600 Localized edema: Secondary | ICD-10-CM

## 2014-06-15 NOTE — Patient Instructions (Signed)
Your physician recommends that you schedule a follow-up appointment as needed with Dr. McAlhany  Your physician has requested that you have an echocardiogram. Echocardiography is a painless test that uses sound waves to create images of your heart. It provides your doctor with information about the size and shape of your heart and how well your heart's chambers and valves are working. This procedure takes approximately one hour. There are no restrictions for this procedure.    
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
## 2014-06-16 ENCOUNTER — Ambulatory Visit (HOSPITAL_COMMUNITY): Payer: 59 | Attending: Internal Medicine | Admitting: Radiology

## 2014-06-16 DIAGNOSIS — E669 Obesity, unspecified: Secondary | ICD-10-CM | POA: Insufficient documentation

## 2014-06-16 DIAGNOSIS — E119 Type 2 diabetes mellitus without complications: Secondary | ICD-10-CM | POA: Diagnosis not present

## 2014-06-16 DIAGNOSIS — E785 Hyperlipidemia, unspecified: Secondary | ICD-10-CM | POA: Insufficient documentation

## 2014-06-16 DIAGNOSIS — R600 Localized edema: Secondary | ICD-10-CM | POA: Insufficient documentation

## 2014-06-16 NOTE — Progress Notes (Signed)
Echocardiogram performed.  

## 2014-06-22 ENCOUNTER — Other Ambulatory Visit: Payer: Self-pay | Admitting: Obstetrics and Gynecology

## 2014-06-22 LAB — HM MAMMOGRAPHY

## 2014-06-23 LAB — CYTOLOGY - PAP

## 2014-07-27 ENCOUNTER — Other Ambulatory Visit: Payer: Self-pay | Admitting: Internal Medicine

## 2014-08-02 ENCOUNTER — Ambulatory Visit: Payer: 59 | Admitting: Internal Medicine

## 2014-08-04 ENCOUNTER — Telehealth: Payer: Self-pay | Admitting: *Deleted

## 2014-08-04 NOTE — Telephone Encounter (Signed)
[facility]

## 2014-08-11 NOTE — Telephone Encounter (Signed)
Patient Insurance company denied authorization for MRI . Dr Lenord FellersBaxley aware pt to be referred to orthopedics

## 2014-08-11 NOTE — Telephone Encounter (Signed)
Patient states she see [facility] for orthopedics she states she will call them for an appt . She will call us back if she has any problems getting an appt.

## 2014-09-29 ENCOUNTER — Other Ambulatory Visit: Payer: Self-pay | Admitting: Internal Medicine

## 2014-09-30 ENCOUNTER — Encounter: Payer: Self-pay | Admitting: Internal Medicine

## 2014-12-15 ENCOUNTER — Other Ambulatory Visit: Payer: Self-pay | Admitting: Internal Medicine

## 2014-12-16 ENCOUNTER — Encounter: Payer: Self-pay | Admitting: Internal Medicine

## 2014-12-20 ENCOUNTER — Ambulatory Visit (INDEPENDENT_AMBULATORY_CARE_PROVIDER_SITE_OTHER): Payer: 59 | Admitting: Nurse Practitioner

## 2014-12-20 VITALS — BP 120/90 | HR 74

## 2014-12-20 DIAGNOSIS — I10 Essential (primary) hypertension: Secondary | ICD-10-CM | POA: Diagnosis not present

## 2014-12-20 NOTE — Patient Instructions (Signed)
Medication Instructions:  Your physician recommends that you continue on your current medications as directed. Please refer to the Current Medication list given to you today.   Labwork: None   Testing/Procedures: None  Follow-Up: Your physician recommends that you return on June 6 at 1:45 with Jacolyn ReedyMichele Lenze, PA   Call [phone] and ask for Triage nurse if you have questions or concerns prior to your next appointment
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
## 2015-01-12 ENCOUNTER — Ambulatory Visit (INDEPENDENT_AMBULATORY_CARE_PROVIDER_SITE_OTHER): Payer: 59 | Admitting: Physician Assistant

## 2015-01-12 ENCOUNTER — Encounter: Payer: Self-pay | Admitting: Physician Assistant

## 2015-01-12 VITALS — BP 160/70 | HR 57 | Ht 66.5 in | Wt 239.0 lb

## 2015-01-12 DIAGNOSIS — R030 Elevated blood-pressure reading, without diagnosis of hypertension: Secondary | ICD-10-CM

## 2015-01-12 DIAGNOSIS — D649 Anemia, unspecified: Secondary | ICD-10-CM | POA: Diagnosis not present

## 2015-01-12 DIAGNOSIS — E785 Hyperlipidemia, unspecified: Secondary | ICD-10-CM | POA: Diagnosis not present

## 2015-01-12 DIAGNOSIS — R600 Localized edema: Secondary | ICD-10-CM

## 2015-01-12 DIAGNOSIS — R079 Chest pain, unspecified: Secondary | ICD-10-CM

## 2015-01-12 DIAGNOSIS — IMO0001 Reserved for inherently not codable concepts without codable children: Secondary | ICD-10-CM

## 2015-01-12 LAB — CBC WITH DIFFERENTIAL/PLATELET
BASOS PCT: 0.1 % (ref 0.0–3.0)
Basophils Absolute: 0 10*3/uL (ref 0.0–0.1)
EOS ABS: 0.2 10*3/uL (ref 0.0–0.7)
EOS PCT: 3.1 % (ref 0.0–5.0)
HCT: 35 % — ABNORMAL LOW (ref 36.0–46.0)
Hemoglobin: 11.1 g/dL — ABNORMAL LOW (ref 12.0–15.0)
LYMPHS ABS: 2.3 10*3/uL (ref 0.7–4.0)
Lymphocytes Relative: 31.2 % (ref 12.0–46.0)
MCHC: 31.9 g/dL (ref 30.0–36.0)
MCV: 71.3 fl — ABNORMAL LOW (ref 78.0–100.0)
MONO ABS: 0.5 10*3/uL (ref 0.1–1.0)
Monocytes Relative: 7.3 % (ref 3.0–12.0)
NEUTROS ABS: 4.3 10*3/uL (ref 1.4–7.7)
Neutrophils Relative %: 58.3 % (ref 43.0–77.0)
PLATELETS: 291 10*3/uL (ref 150.0–400.0)
RBC: 4.9 Mil/uL (ref 3.87–5.11)
RDW: 15.8 % — ABNORMAL HIGH (ref 11.5–15.5)
WBC: 7.4 10*3/uL (ref 4.0–10.5)

## 2015-01-12 LAB — URINALYSIS
Bilirubin Urine: NEGATIVE
Ketones, ur: NEGATIVE
Leukocytes, UA: NEGATIVE
Nitrite: NEGATIVE
PH: 5.5 (ref 5.0–8.0)
Specific Gravity, Urine: >=1.03 — AB (ref 1.000–1.030)
Total Protein, Urine: NEGATIVE
URINE GLUCOSE: NEGATIVE
Urobilinogen, UA: 0.2 (ref 0.0–1.0)

## 2015-01-12 LAB — BASIC METABOLIC PANEL
BUN: 19 mg/dL (ref 6–23)
CO2: 26 meq/L (ref 19–32)
CREATININE: 0.74 mg/dL (ref 0.40–1.20)
Calcium: 8.9 mg/dL (ref 8.4–10.5)
Chloride: 105 mEq/L (ref 96–112)
GFR: 103.98 mL/min (ref >60.00)
GLUCOSE: 87 mg/dL (ref 70–99)
POTASSIUM: 3.6 meq/L (ref 3.5–5.1)
SODIUM: 137 meq/L (ref 135–145)

## 2015-01-12 LAB — TSH: TSH: 1.22 u[IU]/mL (ref 0.35–4.50)
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
## 2015-01-12 NOTE — Patient Instructions (Addendum)
Medication Instructions:  Your physician has recommended you make the following change in your medication:  1-STOP lasix  Labwork: Your physician recommends that you have labs today. BMET, CBC, TSH, and UA  Testing/Procedures: NONE  Follow-Up: Your physician recommends that you schedule a follow-up appointment in: 6 weeks with Dr. Clifton JamesMcAlhany or PA/NP.  Any Other Special Instructions Will Be Listed Below (If Applicable).

## 2015-01-13 ENCOUNTER — Other Ambulatory Visit: Payer: Self-pay | Admitting: Internal Medicine

## 2015-01-13 ENCOUNTER — Other Ambulatory Visit: Payer: Self-pay

## 2015-01-13 DIAGNOSIS — I10 Essential (primary) hypertension: Secondary | ICD-10-CM

## 2015-01-13 MED ORDER — LISINOPRIL 5 MG PO TABS: 5.0000 mg | ORAL_TABLET | Freq: Every day | ORAL | Status: DC

## 2015-01-13 MED ORDER — ASPIRIN 81 MG PO TBEC: 81.0000 mg | DELAYED_RELEASE_TABLET | Freq: Every day | ORAL | Status: AC

## 2015-01-13 NOTE — Progress Notes (Signed)
Quick Note:  Please call patient.  BMET is normal. Please start lisinopril 5mg  daily for high blood pressure. Please monitor blood pressure occasionally at home - once a day is fine, preferably several hours after taking medication, can take at different times of the day to get an average sense of how it's running. Return for nurse BP check in 1 week with her log. CBC shows mild anemia - appears chronic dating back to at least 3 years ago. UA did show trace microscopic hemoglobin (blood) so she needs to discuss further monitoring of this with her PCP. As long as she is not having any obvious bleeding, please have her start aspirin 81mg  EC daily for cardiac prevention. If she has any evidence of bleeding while on aspirin she'll need to let our office know and her PCP. Thyroid was normal - this is not causing her high BP. Please find out if she snores or if anyone has ever told her she stops breathing at night - if so, refer for sleep study to exclude sleep apnea as this can be a cause for onset of high blood pressure. Since we are starting blood pressure medicine and aspirin, recheck CBC and BMET in 7-10 days - can be same day as BP check. Thanks. Jezreel Sisk PA-C  ______

## 2015-01-16 ENCOUNTER — Ambulatory Visit: Payer: Self-pay | Admitting: Physician Assistant

## 2015-01-16 ENCOUNTER — Encounter: Payer: Self-pay | Admitting: Internal Medicine

## 2015-01-20 ENCOUNTER — Other Ambulatory Visit: Payer: 59

## 2015-01-30 ENCOUNTER — Other Ambulatory Visit: Payer: 59

## 2015-02-08 ENCOUNTER — Other Ambulatory Visit: Payer: 59

## 2015-02-15 ENCOUNTER — Other Ambulatory Visit (INDEPENDENT_AMBULATORY_CARE_PROVIDER_SITE_OTHER): Payer: 59 | Admitting: *Deleted

## 2015-02-15 ENCOUNTER — Other Ambulatory Visit: Payer: 59

## 2015-02-15 ENCOUNTER — Ambulatory Visit (INDEPENDENT_AMBULATORY_CARE_PROVIDER_SITE_OTHER): Payer: 59

## 2015-02-15 VITALS — BP 136/84 | HR 69 | Wt 237.0 lb

## 2015-02-15 DIAGNOSIS — I10 Essential (primary) hypertension: Secondary | ICD-10-CM

## 2015-02-15 LAB — BASIC METABOLIC PANEL
BUN: 15 mg/dL (ref 6–23)
CHLORIDE: 107 meq/L (ref 96–112)
CO2: 25 mEq/L (ref 19–32)
CREATININE: 0.75 mg/dL (ref 0.40–1.20)
Calcium: 8.8 mg/dL (ref 8.4–10.5)
GFR: 102.35 mL/min (ref >60.00)
Glucose, Bld: 86 mg/dL (ref 70–99)
Potassium: 3.7 mEq/L (ref 3.5–5.1)
Sodium: 139 mEq/L (ref 135–145)

## 2015-02-15 NOTE — Progress Notes (Signed)
Please have patient follow BP periodically at home and call if running >140/90. Thanks. Alanah Sakuma PA-C

## 2015-02-15 NOTE — Progress Notes (Signed)
Agree. Thanks

## 2015-02-15 NOTE — Progress Notes (Signed)
Patient states she isn't taking medicine the doctor gave her for her blood pressure.  Said, "I took one pill".  Doesn't think she needs to take anything because she was under a lot of stress the day it was taken and thinks she should be monitored before she takes anything

## 2015-02-16 ENCOUNTER — Telehealth: Payer: Self-pay

## 2015-02-16 LAB — CBC WITH DIFFERENTIAL/PLATELET
Basophils Absolute: 0 10*3/uL (ref 0.0–0.1)
Basophils Relative: 0 % (ref 0.0–3.0)
EOS PCT: 5.2 % — AB (ref 0.0–5.0)
Eosinophils Absolute: 0.4 10*3/uL (ref 0.0–0.7)
HCT: 35.5 % — ABNORMAL LOW (ref 36.0–46.0)
HEMOGLOBIN: 11.4 g/dL — AB (ref 12.0–15.0)
LYMPHS ABS: 2.5 10*3/uL (ref 0.7–4.0)
Lymphocytes Relative: 32.9 % (ref 12.0–46.0)
MCHC: 32 g/dL (ref 30.0–36.0)
MCV: 71 fl — ABNORMAL LOW (ref 78.0–100.0)
MONOS PCT: 4.9 % (ref 3.0–12.0)
Monocytes Absolute: 0.4 10*3/uL (ref 0.1–1.0)
NEUTROS ABS: 4.3 10*3/uL (ref 1.4–7.7)
Neutrophils Relative %: 57 % (ref 43.0–77.0)
Platelets: 309 10*3/uL (ref 150.0–400.0)
RBC: 5 Mil/uL (ref 3.87–5.11)
RDW: 16.2 % — ABNORMAL HIGH (ref 11.5–15.5)
WBC: 7.5 10*3/uL (ref 4.0–10.5)

## 2015-02-16 NOTE — Telephone Encounter (Signed)
Called patient and asked her per request of L. Dunn APP PA to take her blood pressure periodically.  Instructed her to take her blood pressure at least once a week.  Patient is going to take it daily this week until her physician appointment on Monday

## 2015-02-20 ENCOUNTER — Ambulatory Visit: Payer: 59 | Admitting: Physician Assistant

## 2015-02-23 ENCOUNTER — Other Ambulatory Visit: Payer: 59

## 2015-03-07 ENCOUNTER — Telehealth: Payer: Self-pay | Admitting: Internal Medicine

## 2015-03-07 NOTE — Telephone Encounter (Signed)
Patient has been seen in cardiology for evaluation of elevated blood pressure and lower extremity edema. She has had a 2-D echocardiogram. TSH was normal. Hemoglobin was slightly low with a decreased MCV. TSH was normal. There's been no follow-up with regard to her abnormal glucose in September 2015. She called today to cancel her physical exam appointment for August 2 saying she had a lot of lab work done elsewhere and wanted to put this off until September. We will reschedule her physical exam for September recognizing that she is hard he had some lab work and may not need as much. She says she's had mammogram at GYN office, Dr. Rana Snare

## 2015-03-13 ENCOUNTER — Encounter: Payer: Self-pay | Admitting: *Deleted

## 2015-03-13 ENCOUNTER — Other Ambulatory Visit: Payer: Self-pay | Admitting: Internal Medicine

## 2015-03-14 ENCOUNTER — Encounter: Payer: Self-pay | Admitting: Internal Medicine

## 2015-03-15 NOTE — Telephone Encounter (Signed)
This encounter was created in error - please disregard.

## 2015-03-27 ENCOUNTER — Ambulatory Visit (INDEPENDENT_AMBULATORY_CARE_PROVIDER_SITE_OTHER): Payer: 59 | Admitting: Physician Assistant

## 2015-03-27 ENCOUNTER — Encounter: Payer: Self-pay | Admitting: Physician Assistant

## 2015-03-27 VITALS — BP 142/100 | HR 68 | Ht 66.5 in | Wt 240.0 lb

## 2015-03-27 DIAGNOSIS — I10 Essential (primary) hypertension: Secondary | ICD-10-CM

## 2015-03-27 DIAGNOSIS — E785 Hyperlipidemia, unspecified: Secondary | ICD-10-CM | POA: Diagnosis not present

## 2015-03-27 DIAGNOSIS — E669 Obesity, unspecified: Secondary | ICD-10-CM

## 2015-03-27 MED ORDER — LISINOPRIL 5 MG PO TABS: 5.0000 mg | ORAL_TABLET | Freq: Every day | ORAL | Status: DC

## 2015-03-27 NOTE — Assessment & Plan Note (Signed)
Exercise and weight loss program discussed

## 2015-03-27 NOTE — Assessment & Plan Note (Signed)
Diet and weight loss program recommended.

## 2015-03-27 NOTE — Patient Instructions (Signed)
Medication Instructions:  Start Lisinopril ( 5 mg ) daily, sent in pt's requested pharmacy  Labwork: -None  Testing/Procedures: -none  Follow-Up: Keep Follow up  Any Other Special Instructions Will Be Listed Below (If Applicable).  Weight loss is suggested. Low-Sodium Eating Plan Sodium raises blood pressure and causes water to be held in the body. Getting less sodium from food will help lower your blood pressure, reduce any swelling, and protect your heart, liver, and kidneys. We get sodium by adding salt (sodium chloride) to food. Most of our sodium comes from canned, boxed, and frozen foods. Restaurant foods, fast foods, and pizza are also very high in sodium. Even if you take medicine to lower your blood pressure or to reduce fluid in your body, getting less sodium from your food is important. WHAT IS MY PLAN? Most people should limit their sodium intake to 2,300 mg a day. Your health care provider recommends that you limit your sodium intake to ___2Gram_______ a day.  WHAT DO I NEED TO KNOW ABOUT THIS EATING PLAN? For the low-sodium eating plan, you will follow these general guidelines:  Choose foods with a % Daily Value for sodium of less than 5% (as listed on the food label).   Use salt-free seasonings or herbs instead of table salt or sea salt.   Check with your health care provider or pharmacist before using salt substitutes.   Eat fresh foods.  Eat more vegetables and fruits.  Limit canned vegetables. If you do use them, rinse them well to decrease the sodium.   Limit cheese to 1 oz (28 g) per day.   Eat lower-sodium products, often labeled as "lower sodium" or "no salt added."  Avoid foods that contain monosodium glutamate (MSG). MSG is sometimes added to [place] food and some canned foods.  Check food labels (Nutrition Facts labels) on foods to learn how much sodium is in one serving.  Eat more home-cooked food and less restaurant, buffet, and fast  food.  When eating at a restaurant, ask that your food be prepared with less salt or none, if possible.  HOW DO I READ FOOD LABELS FOR SODIUM INFORMATION? The Nutrition Facts label lists the amount of sodium in one serving of the food. If you eat more than one serving, you must multiply the listed amount of sodium by the number of servings. Food labels may also identify foods as:  Sodium free--Less than 5 mg in a serving.  Very low sodium--35 mg or less in a serving.  Low sodium--140 mg or less in a serving.  Light in sodium--50% less sodium in a serving. For example, if a food that usually has 300 mg of sodium is changed to become light in sodium, it will have 150 mg of sodium.  Reduced sodium--25% less sodium in a serving. For example, if a food that usually has 400 mg of sodium is changed to reduced sodium, it will have 300 mg of sodium. WHAT FOODS CAN I EAT? Grains Low-sodium cereals, including oats, puffed wheat and rice, and shredded wheat cereals. Low-sodium crackers. Unsalted rice and pasta. Lower-sodium bread.  Vegetables Frozen or fresh vegetables. Low-sodium or reduced-sodium canned vegetables. Low-sodium or reduced-sodium tomato sauce and paste. Low-sodium or reduced-sodium tomato and vegetable juices.  Fruits Fresh, frozen, and canned fruit. Fruit juice.  Meat and Other Protein Products Low-sodium canned tuna and salmon. Fresh or frozen meat, poultry, seafood, and fish. Lamb. Unsalted nuts. Dried beans, peas, and lentils without added salt. Unsalted canned beans. Homemade  soups without salt. Eggs.  Dairy Milk. Soy milk. Ricotta cheese. Low-sodium or reduced-sodium cheeses. Yogurt.  Condiments Fresh and dried herbs and spices. Salt-free seasonings. Onion and garlic powders. Low-sodium varieties of mustard and ketchup. Lemon juice.  Fats and Oils Reduced-sodium salad dressings. Unsalted butter.  Other Unsalted popcorn and pretzels.  The items listed above may  not be a complete list of recommended foods or beverages. Contact your dietitian for more options. WHAT FOODS ARE NOT RECOMMENDED? Grains Instant hot cereals. Bread stuffing, pancake, and biscuit mixes. Croutons. Seasoned rice or pasta mixes. Noodle soup cups. Boxed or frozen macaroni and cheese. Self-rising flour. Regular salted crackers. Vegetables Regular canned vegetables. Regular canned tomato sauce and paste. Regular tomato and vegetable juices. Frozen vegetables in sauces. Salted french fries. Olives. Rosita Fire. Relishes. Sauerkraut. Salsa. Meat and Other Protein Products Salted, canned, smoked, spiced, or pickled meats, seafood, or fish. Bacon, ham, sausage, hot dogs, corned beef, chipped beef, and packaged luncheon meats. Salt pork. Jerky. Pickled herring. Anchovies, regular canned tuna, and sardines. Salted nuts. Dairy Processed cheese and cheese spreads. Cheese curds. Blue cheese and cottage cheese. Buttermilk.  Condiments Onion and garlic salt, seasoned salt, table salt, and sea salt. Canned and packaged gravies. Worcestershire sauce. Tartar sauce. Barbecue sauce. Teriyaki sauce. Soy sauce, including reduced sodium. Steak sauce. Fish sauce. Oyster sauce. Cocktail sauce. Horseradish. Regular ketchup and mustard. Meat flavorings and tenderizers. Bouillon cubes. Hot sauce. Tabasco sauce. Marinades. Taco seasonings. Relishes. Fats and Oils Regular salad dressings. Salted butter. Margarine. Ghee. Bacon fat.  Other Potato and tortilla chips. Corn chips and puffs. Salted popcorn and pretzels. Canned or dried soups. Pizza. Frozen entrees and pot pies.  The items listed above may not be a complete list of foods and beverages to avoid. Contact your dietitian for more information. Document Released: 01/18/2002 Document Revised: 08/03/2013 Document Reviewed: 06/02/2013 [facility] Patient Information 2015 [place], [place]. This information is not intended to replace advice given to you by your  health care provider. Make sure you discuss any questions you have with your health care provider.

## 2015-03-27 NOTE — Assessment & Plan Note (Signed)
BP is elevated again today. She is now willing to try the lisinopril 5 mg daily. 2 g sodium diet and weight loss program recommended. She has follow-up with Dr.McAlhany in Sept.
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
## 2015-04-20 ENCOUNTER — Other Ambulatory Visit (INDEPENDENT_AMBULATORY_CARE_PROVIDER_SITE_OTHER): Payer: 59 | Admitting: Internal Medicine

## 2015-04-20 DIAGNOSIS — E119 Type 2 diabetes mellitus without complications: Secondary | ICD-10-CM

## 2015-04-20 DIAGNOSIS — Z79899 Other long term (current) drug therapy: Secondary | ICD-10-CM

## 2015-04-20 DIAGNOSIS — D649 Anemia, unspecified: Secondary | ICD-10-CM

## 2015-04-20 LAB — CBC WITH DIFFERENTIAL/PLATELET
BASOS ABS: 0 10*3/uL (ref 0.0–0.1)
Basophils Relative: 0 % (ref 0–1)
EOS PCT: 5 % (ref 0–5)
Eosinophils Absolute: 0.3 10*3/uL (ref 0.0–0.7)
HEMATOCRIT: 37.2 % (ref 36.0–46.0)
Hemoglobin: 11.5 g/dL — ABNORMAL LOW (ref 12.0–15.0)
LYMPHS PCT: 32 % (ref 12–46)
Lymphs Abs: 1.8 10*3/uL (ref 0.7–4.0)
MCH: 22.4 pg — ABNORMAL LOW (ref 26.0–34.0)
MCHC: 30.9 g/dL (ref 30.0–36.0)
MCV: 72.4 fL — ABNORMAL LOW (ref 78.0–100.0)
MPV: 9.4 fL (ref 8.6–12.4)
Monocytes Absolute: 0.4 10*3/uL (ref 0.1–1.0)
Monocytes Relative: 7 % (ref 3–12)
Neutro Abs: 3.2 10*3/uL (ref 1.7–7.7)
Neutrophils Relative %: 56 % (ref 43–77)
Platelets: 319 10*3/uL (ref 150–400)
RBC: 5.14 MIL/uL — ABNORMAL HIGH (ref 3.87–5.11)
RDW: 16.6 % — AB (ref 11.5–15.5)
WBC: 5.7 10*3/uL (ref 4.0–10.5)

## 2015-04-20 LAB — COMPREHENSIVE METABOLIC PANEL
ALK PHOS: 82 U/L (ref 33–130)
ALT: 11 U/L (ref 6–29)
AST: 15 U/L (ref 10–35)
Albumin: 4.2 g/dL (ref 3.6–5.1)
BILIRUBIN TOTAL: 0.4 mg/dL (ref 0.2–1.2)
BUN: 19 mg/dL (ref 7–25)
CO2: 24 mmol/L (ref 20–31)
Calcium: 8.8 mg/dL (ref 8.6–10.4)
Chloride: 104 mmol/L (ref 98–110)
Creat: 0.7 mg/dL (ref 0.50–1.05)
GLUCOSE: 90 mg/dL (ref 65–99)
POTASSIUM: 4.2 mmol/L (ref 3.5–5.3)
Sodium: 136 mmol/L (ref 135–146)
TOTAL PROTEIN: 6.9 g/dL (ref 6.1–8.1)

## 2015-04-20 LAB — IRON AND TIBC
%SAT: 23 % (ref 11–50)
Iron: 67 ug/dL (ref 45–160)
TIBC: 288 ug/dL (ref 250–450)
UIBC: 221 ug/dL (ref 125–400)

## 2015-04-20 LAB — POCT URINALYSIS DIPSTICK
Bilirubin, UA: NEGATIVE
Glucose, UA: NEGATIVE
Ketones, UA: NEGATIVE
Leukocytes, UA: NEGATIVE
Nitrite, UA: NEGATIVE
PH UA: 6
Protein, UA: NEGATIVE
RBC UA: NEGATIVE
SPEC GRAV UA: 1.02
UROBILINOGEN UA: NEGATIVE

## 2015-04-21 ENCOUNTER — Ambulatory Visit (INDEPENDENT_AMBULATORY_CARE_PROVIDER_SITE_OTHER): Payer: 59 | Admitting: Internal Medicine

## 2015-04-21 ENCOUNTER — Encounter: Payer: Self-pay | Admitting: Internal Medicine

## 2015-04-21 VITALS — BP 136/86 | HR 68 | Temp 97.9°F | Ht 67.0 in | Wt 236.5 lb

## 2015-04-21 DIAGNOSIS — I10 Essential (primary) hypertension: Secondary | ICD-10-CM | POA: Diagnosis not present

## 2015-04-21 DIAGNOSIS — J069 Acute upper respiratory infection, unspecified: Secondary | ICD-10-CM | POA: Diagnosis not present

## 2015-04-21 DIAGNOSIS — Z0000 Encounter for general adult medical examination without abnormal findings: Secondary | ICD-10-CM

## 2015-04-21 DIAGNOSIS — R609 Edema, unspecified: Secondary | ICD-10-CM

## 2015-04-21 DIAGNOSIS — E669 Obesity, unspecified: Secondary | ICD-10-CM

## 2015-04-21 DIAGNOSIS — R7302 Impaired glucose tolerance (oral): Secondary | ICD-10-CM

## 2015-04-21 DIAGNOSIS — E8881 Metabolic syndrome: Secondary | ICD-10-CM | POA: Diagnosis not present

## 2015-04-21 LAB — MICROALBUMIN / CREATININE URINE RATIO
CREATININE, URINE: 185.2 mg/dL
MICROALB/CREAT RATIO: 5.4 mg/g (ref 0.0–30.0)
Microalb, Ur: 1 mg/dL (ref <2.0)

## 2015-04-21 LAB — HEMOGLOBIN A1C
HEMOGLOBIN A1C: 6.2 % — AB (ref <5.7)
MEAN PLASMA GLUCOSE: 131 mg/dL — AB (ref <117)

## 2015-04-21 MED ORDER — HYDROCODONE-HOMATROPINE 5-1.5 MG/5ML PO SYRP: 5.0000 mL | ORAL_SOLUTION | Freq: Three times a day (TID) | ORAL | Status: DC | PRN

## 2015-04-21 MED ORDER — AZITHROMYCIN 250 MG PO TABS: ORAL_TABLET | ORAL | Status: DC

## 2015-04-21 NOTE — Patient Instructions (Signed)
Watch diet. Exercise and continue weight loss efforts. Return in one month for flu vaccine. Treated with Zithromax Z-Pak and Hycodan for respiratory infection.
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
## 2015-04-26 ENCOUNTER — Ambulatory Visit (INDEPENDENT_AMBULATORY_CARE_PROVIDER_SITE_OTHER): Payer: 59 | Admitting: Cardiovascular Disease

## 2015-04-26 ENCOUNTER — Encounter: Payer: Self-pay | Admitting: Cardiovascular Disease

## 2015-04-26 VITALS — BP 132/94 | HR 63 | Ht 66.5 in | Wt 235.0 lb

## 2015-04-26 DIAGNOSIS — R600 Localized edema: Secondary | ICD-10-CM | POA: Diagnosis not present

## 2015-04-26 DIAGNOSIS — I10 Essential (primary) hypertension: Secondary | ICD-10-CM

## 2015-04-26 MED ORDER — LISINOPRIL 5 MG PO TABS
5.0000 mg | ORAL_TABLET | Freq: Every day | ORAL | Status: DC
Start: 2015-04-26 — End: 2015-06-08

## 2015-04-26 NOTE — Patient Instructions (Signed)
Medication Instructions:  Your physician recommends that you continue on your current medications as directed. Please refer to the Current Medication list given to you today.   Labwork: none  Testing/Procedures: none  Follow-Up: Your physician recommends that you schedule a follow-up appointment as needed with Dr. McAlhany        

## 2015-05-04 ENCOUNTER — Telehealth: Payer: Self-pay | Admitting: Internal Medicine

## 2015-05-04 MED ORDER — AZITHROMYCIN 250 MG PO TABS: ORAL_TABLET | ORAL | Status: DC

## 2015-05-04 NOTE — Telephone Encounter (Signed)
Z-Pak Rx sent to pharmacy

## 2015-05-04 NOTE — Telephone Encounter (Signed)
States she is still having some congestion.  The Hycodan does help the cough, but it makes her feel very "high" and "dizzy" and she doesn't like the way it makes her feel.  Is there something else that she could use during the day that she could take while she's working so she doesn't have the dizziness?    Pharmacy:  [facility] on [address].    Spoke with Dr. Lenord Fellers; patient can take Delsym OTC for the cough during the day.  Verbal order by Dr. Lenord Fellers to refill Z pack.  Will have Rosalita Chessman e-scribe that to patient's pharmacy.   Take this Z-pack and if not better once this Z-pack has been completed, please call the office to make an appointment to be seen.  This does take some time to get over.  Advise patient that the Z-pack does stay in your system for 10 days after completing.  So, she needs to rest and give it time to get over this.    Thanks.

## 2015-05-04 NOTE — Telephone Encounter (Signed)
Spoke with patient reviewed instructions. 

## 2015-05-24 ENCOUNTER — Ambulatory Visit: Payer: 59 | Admitting: Internal Medicine

## 2015-05-26 ENCOUNTER — Ambulatory Visit: Payer: 59 | Admitting: Internal Medicine

## 2015-05-30 ENCOUNTER — Ambulatory Visit: Payer: 59 | Admitting: Internal Medicine

## 2015-06-08 ENCOUNTER — Ambulatory Visit (INDEPENDENT_AMBULATORY_CARE_PROVIDER_SITE_OTHER): Payer: 59 | Admitting: Internal Medicine

## 2015-06-08 ENCOUNTER — Encounter: Payer: Self-pay | Admitting: Internal Medicine

## 2015-06-08 ENCOUNTER — Ambulatory Visit
Admission: RE | Admit: 2015-06-08 | Discharge: 2015-06-08 | Disposition: A | Discharge status: Home / Self Care | Payer: 59 | Source: Ambulatory Visit | Attending: Internal Medicine | Admitting: Internal Medicine

## 2015-06-08 ENCOUNTER — Telehealth: Payer: Self-pay | Admitting: Internal Medicine

## 2015-06-08 VITALS — BP 134/90 | HR 66 | Temp 98.5°F | Resp 18 | Ht 67.0 in | Wt 234.0 lb

## 2015-06-08 DIAGNOSIS — F411 Generalized anxiety disorder: Secondary | ICD-10-CM

## 2015-06-08 DIAGNOSIS — R05 Cough: Secondary | ICD-10-CM

## 2015-06-08 DIAGNOSIS — J309 Allergic rhinitis, unspecified: Secondary | ICD-10-CM | POA: Diagnosis not present

## 2015-06-08 DIAGNOSIS — K219 Gastro-esophageal reflux disease without esophagitis: Secondary | ICD-10-CM | POA: Diagnosis not present

## 2015-06-08 DIAGNOSIS — R053 Chronic cough: Secondary | ICD-10-CM

## 2015-06-08 DIAGNOSIS — I10 Essential (primary) hypertension: Secondary | ICD-10-CM | POA: Diagnosis not present

## 2015-06-08 DIAGNOSIS — E119 Type 2 diabetes mellitus without complications: Secondary | ICD-10-CM | POA: Diagnosis not present

## 2015-06-08 MED ORDER — LOSARTAN POTASSIUM 25 MG PO TABS: 25.0000 mg | ORAL_TABLET | Freq: Every day | ORAL | Status: DC

## 2015-06-08 MED ORDER — ESOMEPRAZOLE MAGNESIUM 40 MG PO CPDR: 40.0000 mg | DELAYED_RELEASE_CAPSULE | Freq: Every day | ORAL | Status: DC

## 2015-06-08 MED ORDER — ALPRAZOLAM 0.25 MG PO TABS: 0.2500 mg | ORAL_TABLET | Freq: Two times a day (BID) | ORAL | Status: DC | PRN

## 2015-06-08 NOTE — Telephone Encounter (Signed)
Pt called and said she had not gotten call back regarding CXR results done earlier today. Told her results were normal and she was reassured.

## 2015-06-08 NOTE — Patient Instructions (Addendum)
Have chest x-ray. Stop lisinopril. Take Cozaar 25 mg daily. Start Nexium. Return November 18. Take Xanax for  anxiety.

## 2015-06-08 NOTE — Progress Notes (Signed)
   Subjective:    Patient ID: Barbara [name], female    DOB: 12/22/1955, 59 y.o.   MRN: [id number]  HPI Was seen 04/21/2015 complaining of cough and congestion. Was treated with Zithromax Z-Pak and Hycodan. Set Hycodan made her feel woozy. She called back on September 22 saying that she was still having some congestion. Could not tolerate Hycodan. Was advised to use Delsym and Zithromax was refilled. Was told to make a follow-up ointment if not better in 10 days after taking the Zithromax Z-Pak. This is the first time we've seen her since then.  Patient is been talking to friends and relatives. She thinks lisinopril might be causing her to cough. She went to see Dr. Grey Eagle CallasSharma recently and had to be re-allergy tested because he had been a long time that she had been seen the year as she was a patient of Dr. Martyn EhrichGene LeBaurer previously. She has one dog. Keep [place] bedroom. Allergy skin testing revealed significant results to dust mites. Intradermal testing revealed reactions to feathers, dust mite, cockroach. Food testing was negative. She was started on Xyzal, given Depo-Medrol, started on Singulair and given Azo last and eyedrops. Depo-Medrol seem to help which would tend to suggest an allergy etiology.  Her brother has history of sarcoidosis and she is concerned about that as a reason for coughing.  She has developed anxiety since her good friend had a cardiac arrest in front of her Nicole CellaDorothy tried to perform CPR but the patient expired. Subsequently Nicole CellaDorothy had to see cardiologist for chest pain and elevated blood pressure. She was started on low-dose lisinopril at cardiology office. She has a history of essential hypertension, type 2 diabetes mellitus, obesity, hyperlipidemia, metabolic syndrome.  She is retired from the [employer] and continues to work at [employer].  Spent considerable time today talking with her about reasons for cough. We talked about ACE inhibitors, allergy, GE reflux, asthma,  endobronchial tumors, anxiety and chronic cough conditions. She says from a tree was negative at the allergy office.  Admits to some heartburn at times. Doesn't take a PPI or H2 blocker  She's also having some issues with anxiety and was prescribed Xanax to take twice a day when necessary sparingly.      Review of Systems as above     Objective:   Physical Exam TMs are chronically scarred and dull. Pharynx clear. Neck supple. Chest clear to auscultation without rales or wheezing. Cardiac exam regular rate and rhythm. Extremities without edema.       Assessment & Plan:  Chronic cough for several weeks-did not improve with 2 Z-Pak's  Allergic rhinitis-cough improved with Depo-Medrol  Possible GE reflux  Plan: Patient is to have a chest x-ray. She will stop lisinopril. Change to Cozaar 25 mg daily. Given Nexium for reflux.  Addendum: Chest x-ray is negative.

## 2015-06-22 ENCOUNTER — Other Ambulatory Visit: Payer: Self-pay

## 2015-06-22 ENCOUNTER — Telehealth: Payer: Self-pay | Admitting: Internal Medicine

## 2015-06-22 MED ORDER — HYDROCODONE-HOMATROPINE 5-1.5 MG/5ML PO SYRP: 5.0000 mL | ORAL_SOLUTION | Freq: Three times a day (TID) | ORAL | Status: DC | PRN

## 2015-06-22 NOTE — Telephone Encounter (Signed)
Patient states she's still having some difficulty with the cough.  She's moving a little sputum; it's not colored.  No fever.  Patient states she doesn't have any more of the Hycodan.  Hasn't really noticed that much of a difference since the change of BP meds and the addition of Nexium.  Says she notices that the cough seems to be worse when she gets hot.  And, the cough is worse at night.    Please advise.

## 2015-06-22 NOTE — Telephone Encounter (Signed)
Spoke with patient; advised that per Dr. Sherene SiresWert cough could take up to 6 weeks to resolve.  Continue to take Nexium per Dr. Lenord FellersBaxley.  Instructed to come by and pick up Rx for Hycodan.  Provided office hours.  Patient states she'll be by on Friday to pick up Rx.

## 2015-06-22 NOTE — Telephone Encounter (Signed)
Prescription printed

## 2015-06-22 NOTE — Telephone Encounter (Signed)
Dr. Sherene SiresWert told her it might be 6 weeks before cough got better. Continue Nexium. OK to refill Hycodan but she will have to pick up RX

## 2015-06-23 NOTE — Telephone Encounter (Signed)
Patient picked up prescription today-

## 2015-06-30 ENCOUNTER — Encounter: Payer: Self-pay | Admitting: Internal Medicine

## 2015-06-30 ENCOUNTER — Ambulatory Visit (INDEPENDENT_AMBULATORY_CARE_PROVIDER_SITE_OTHER): Payer: 59 | Admitting: Internal Medicine

## 2015-06-30 VITALS — BP 116/80 | HR 58 | Temp 98.5°F | Resp 20 | Ht 67.0 in | Wt 236.0 lb

## 2015-06-30 DIAGNOSIS — I10 Essential (primary) hypertension: Secondary | ICD-10-CM

## 2015-06-30 DIAGNOSIS — E669 Obesity, unspecified: Secondary | ICD-10-CM | POA: Diagnosis not present

## 2015-06-30 DIAGNOSIS — F411 Generalized anxiety disorder: Secondary | ICD-10-CM

## 2015-06-30 DIAGNOSIS — Z8709 Personal history of other diseases of the respiratory system: Secondary | ICD-10-CM | POA: Diagnosis not present

## 2015-06-30 DIAGNOSIS — J069 Acute upper respiratory infection, unspecified: Secondary | ICD-10-CM | POA: Diagnosis not present

## 2015-06-30 DIAGNOSIS — E785 Hyperlipidemia, unspecified: Secondary | ICD-10-CM

## 2015-06-30 DIAGNOSIS — Z87898 Personal history of other specified conditions: Secondary | ICD-10-CM

## 2015-06-30 DIAGNOSIS — E119 Type 2 diabetes mellitus without complications: Secondary | ICD-10-CM

## 2015-06-30 DIAGNOSIS — E8881 Metabolic syndrome: Secondary | ICD-10-CM

## 2015-06-30 NOTE — Patient Instructions (Signed)
It was a pleaseure to see you Return in 6 months.

## 2015-07-11 ENCOUNTER — Encounter: Payer: Self-pay | Admitting: Internal Medicine

## 2015-07-11 DIAGNOSIS — F411 Generalized anxiety disorder: Secondary | ICD-10-CM | POA: Insufficient documentation

## 2015-07-11 NOTE — Progress Notes (Signed)
   Subjective:    Patient ID: Barbara [name], female    DOB: 06/02/1956, 59 y.o.   MRN: [id number]  HPI Original appointment was for follow-up of anxiety. She's feeling much better since starting Xanax. Unfortunately has come down with a respiratory infection. Has cough and congestion. History of chronic cough which has improved stopping ACE inhibitor. Would like refill on Hycodan. Has to work over the Thanksgiving holidays at [employer]. History of metabolic syndrome, controlled type 2 diabetes, essential hypertension, obesity, hyperlipidemia, anxiety related to friend's death, chronic cough improved after stopping ACE inhibitor but may have had an anxiety component as well.    Review of Systems no fever or chills.     Objective:   Physical Exam  Constitutional: She appears well-developed and well-nourished.  HENT:  Right Ear: External ear normal.  Left Ear: External ear normal.  Mouth/Throat: Oropharynx is clear and moist. No oropharyngeal exudate.  Eyes: Conjunctivae are normal. Right eye exhibits no discharge. Left eye exhibits no discharge. No scleral icterus.  Neck: Neck supple. No JVD present. No thyromegaly present.  Cardiovascular: Normal rate and regular rhythm.   Pulmonary/Chest: She has no wheezes. She has no rales.  Musculoskeletal: She exhibits no edema.  Lymphadenopathy:    She has no cervical adenopathy.  Skin: No rash noted.  Psychiatric: She has a normal mood and affect. Her behavior is normal. Thought content normal.  Vitals reviewed.         Assessment & Plan:  Anxiety-improved with Xanax  Hyperlipidemia-stable  Obesity  Essential hypertension-stable  Metabolic syndrome  Controlled type 2 diabetes mellitus-stable  Acute URI  Antibiotic not prescribed. Hycodan refilled.. Continue when necessary Xanax..  Plan: Return in May for six-month recheck lipid panel liver functions and hemoglobin A1c

## 2015-07-31 ENCOUNTER — Other Ambulatory Visit: Payer: Self-pay | Admitting: Internal Medicine
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
## 2015-10-19 ENCOUNTER — Other Ambulatory Visit: Payer: 59 | Admitting: Internal Medicine

## 2015-10-20 ENCOUNTER — Ambulatory Visit: Payer: 59 | Admitting: Internal Medicine

## 2015-12-28 ENCOUNTER — Other Ambulatory Visit: Payer: 59 | Admitting: Internal Medicine

## 2015-12-29 ENCOUNTER — Ambulatory Visit: Payer: 59 | Admitting: Internal Medicine

## 2016-01-09 ENCOUNTER — Other Ambulatory Visit: Payer: Self-pay | Admitting: Internal Medicine

## 2016-01-11 ENCOUNTER — Ambulatory Visit: Payer: Self-pay | Admitting: Internal Medicine

## 2016-03-12 ENCOUNTER — Other Ambulatory Visit: Payer: Self-pay | Admitting: Internal Medicine

## 2016-03-14 ENCOUNTER — Ambulatory Visit: Payer: Self-pay | Admitting: Internal Medicine

## 2016-04-22 ENCOUNTER — Encounter: Payer: Self-pay | Admitting: Internal Medicine

## 2016-04-23 ENCOUNTER — Other Ambulatory Visit: Payer: 59 | Admitting: Internal Medicine

## 2016-04-23 DIAGNOSIS — E785 Hyperlipidemia, unspecified: Secondary | ICD-10-CM

## 2016-04-23 DIAGNOSIS — E119 Type 2 diabetes mellitus without complications: Secondary | ICD-10-CM

## 2016-04-24 LAB — LIPID PANEL
CHOL/HDL RATIO: 5.1 ratio — AB (ref <=5.0)
Cholesterol: 224 mg/dL — ABNORMAL HIGH (ref 125–200)
HDL: 44 mg/dL — ABNORMAL LOW (ref >=46)
LDL Cholesterol: 163 mg/dL — ABNORMAL HIGH (ref <130)
TRIGLYCERIDES: 86 mg/dL (ref <150)
VLDL: 17 mg/dL (ref <30)

## 2016-04-24 LAB — HEMOGLOBIN A1C
Hgb A1c MFr Bld: 5.7 % — ABNORMAL HIGH (ref <5.7)
Mean Plasma Glucose: 117 mg/dL

## 2016-04-25 ENCOUNTER — Ambulatory Visit: Payer: Self-pay | Admitting: Internal Medicine

## 2016-04-30 ENCOUNTER — Ambulatory Visit (INDEPENDENT_AMBULATORY_CARE_PROVIDER_SITE_OTHER): Payer: 59 | Admitting: Internal Medicine

## 2016-04-30 ENCOUNTER — Encounter: Payer: Self-pay | Admitting: Internal Medicine

## 2016-04-30 VITALS — BP 130/84 | HR 83 | Temp 97.6°F | Wt 237.0 lb

## 2016-04-30 DIAGNOSIS — K219 Gastro-esophageal reflux disease without esophagitis: Secondary | ICD-10-CM | POA: Diagnosis not present

## 2016-04-30 DIAGNOSIS — E669 Obesity, unspecified: Secondary | ICD-10-CM | POA: Diagnosis not present

## 2016-04-30 DIAGNOSIS — E8881 Metabolic syndrome: Secondary | ICD-10-CM

## 2016-04-30 DIAGNOSIS — R7302 Impaired glucose tolerance (oral): Secondary | ICD-10-CM | POA: Diagnosis not present

## 2016-04-30 DIAGNOSIS — E785 Hyperlipidemia, unspecified: Secondary | ICD-10-CM | POA: Diagnosis not present

## 2016-04-30 DIAGNOSIS — I10 Essential (primary) hypertension: Secondary | ICD-10-CM | POA: Diagnosis not present

## 2016-04-30 MED ORDER — PHENTERMINE HCL 37.5 MG PO CAPS: 37.5000 mg | ORAL_CAPSULE | ORAL | 0 refills | Status: DC

## 2016-05-03 ENCOUNTER — Encounter: Payer: Self-pay | Admitting: Internal Medicine

## 2016-05-03 NOTE — Patient Instructions (Signed)
Continue to work on diet and exercise regimen. Take phentermine for 30 days only for jumpstart in diet program. Return in 6 months for physical examination. Continue same medications. Please have annual diabetic eye exam.
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
## 2016-05-16 NOTE — Progress Notes (Signed)
This encounter was created in error - please disregard.

## 2016-07-11 DIAGNOSIS — Z029 Encounter for administrative examinations, unspecified: Secondary | ICD-10-CM

## 2016-08-19 ENCOUNTER — Telehealth: Payer: Self-pay | Admitting: Internal Medicine

## 2016-08-19 NOTE — Telephone Encounter (Signed)
Patient called concerned that she had been diagnosed with diabetes but was not taking medication.  Advised her that at some point her a1c level was high enough to diagnose her but doesn't mean she had to have medication for treatment.  Patient understands.

## 2016-09-11 DIAGNOSIS — Z01419 Encounter for gynecological examination (general) (routine) without abnormal findings: Secondary | ICD-10-CM | POA: Diagnosis not present

## 2016-10-22 DIAGNOSIS — M170 Bilateral primary osteoarthritis of knee: Secondary | ICD-10-CM | POA: Diagnosis not present

## 2016-10-22 DIAGNOSIS — G8929 Other chronic pain: Secondary | ICD-10-CM | POA: Diagnosis not present

## 2016-10-22 DIAGNOSIS — M25461 Effusion, right knee: Secondary | ICD-10-CM | POA: Diagnosis not present

## 2016-10-22 DIAGNOSIS — M1711 Unilateral primary osteoarthritis, right knee: Secondary | ICD-10-CM | POA: Diagnosis not present

## 2016-11-05 ENCOUNTER — Other Ambulatory Visit: Payer: 59 | Admitting: Internal Medicine

## 2016-11-07 ENCOUNTER — Encounter: Payer: 59 | Admitting: Internal Medicine

## 2016-11-13 ENCOUNTER — Ambulatory Visit (INDEPENDENT_AMBULATORY_CARE_PROVIDER_SITE_OTHER): Payer: 59 | Admitting: Internal Medicine

## 2016-11-13 ENCOUNTER — Encounter: Payer: Self-pay | Admitting: Internal Medicine

## 2016-11-13 VITALS — BP 110/70 | HR 90 | Temp 98.3°F | Wt 244.0 lb

## 2016-11-13 DIAGNOSIS — J22 Unspecified acute lower respiratory infection: Secondary | ICD-10-CM | POA: Diagnosis not present

## 2016-11-13 DIAGNOSIS — J40 Bronchitis, not specified as acute or chronic: Secondary | ICD-10-CM

## 2016-11-13 MED ORDER — HYDROCODONE-HOMATROPINE 5-1.5 MG/5ML PO SYRP: 5.0000 mL | ORAL_SOLUTION | Freq: Three times a day (TID) | ORAL | 0 refills | Status: DC | PRN

## 2016-11-13 MED ORDER — LEVOFLOXACIN 500 MG PO TABS
500.0000 mg | ORAL_TABLET | Freq: Every day | ORAL | 0 refills | Status: DC
Start: 2016-11-13 — End: 2017-05-19

## 2016-11-13 MED ORDER — METHYLPREDNISOLONE ACETATE 80 MG/ML IJ SUSP
80.0000 mg | Freq: Once | INTRAMUSCULAR | Status: AC
  Administered 2016-11-13: 80 mg via INTRAMUSCULAR

## 2016-11-20 NOTE — Patient Instructions (Addendum)
Levaquin 500 milligrams daily for 10 days. Hycodan 1 teaspoon by mouth every 8 hours when necessary cough. Rest and drink plenty of fluids. Depo-Medrol 80 mg IM.

## 2016-11-20 NOTE — Progress Notes (Signed)
   Subjective:    Patient ID: Barbara [name], female    DOB: Aug 09, 1956, 61 y.o.   MRN: [id number]  HPI 61 year old Black Female in today for respiratory infection onset last week with cough, nasal congestion and headache. Father died recently. Apparently had COPD and had an arrest. Patient continues to work at [employer]. Is retired from the [employer].    Review of Systems see above. No fever or shaking chills or myalgias.     Objective:   Physical Exam Skin warm and dry. Has hacking cough. TMs are clear. Pharynx very slightly injected. Neck supple without adenopathy. Chest clear to auscultation.       Assessment & Plan:  Acute Bronchitis Plan: Levaquin 500 milligrams daily for 10 days. Hycodan 1 teaspoon by mouth every 8 hours when necessary cough. Depo-Medrol 80 mg IM.

## 2016-11-27 DIAGNOSIS — H17821 Peripheral opacity of cornea, right eye: Secondary | ICD-10-CM | POA: Diagnosis not present

## 2016-11-27 DIAGNOSIS — Z961 Presence of intraocular lens: Secondary | ICD-10-CM | POA: Diagnosis not present

## 2016-12-17 DIAGNOSIS — G8929 Other chronic pain: Secondary | ICD-10-CM | POA: Diagnosis not present

## 2016-12-17 DIAGNOSIS — M170 Bilateral primary osteoarthritis of knee: Secondary | ICD-10-CM | POA: Diagnosis not present

## 2017-01-02 ENCOUNTER — Other Ambulatory Visit: Payer: Self-pay | Admitting: Orthopedic Surgery

## 2017-01-02 DIAGNOSIS — M25561 Pain in right knee: Secondary | ICD-10-CM

## 2017-01-02 DIAGNOSIS — M25461 Effusion, right knee: Secondary | ICD-10-CM

## 2017-01-11 ENCOUNTER — Ambulatory Visit
Admission: RE | Admit: 2017-01-11 | Discharge: 2017-01-11 | Disposition: A | Discharge status: Home / Self Care | Payer: 59 | Source: Ambulatory Visit | Attending: Orthopedic Surgery | Admitting: Orthopedic Surgery

## 2017-01-11 DIAGNOSIS — M25561 Pain in right knee: Secondary | ICD-10-CM

## 2017-01-11 DIAGNOSIS — M25461 Effusion, right knee: Secondary | ICD-10-CM

## 2017-01-14 ENCOUNTER — Other Ambulatory Visit: Payer: 59 | Admitting: Internal Medicine

## 2017-01-14 DIAGNOSIS — M25361 Other instability, right knee: Secondary | ICD-10-CM | POA: Diagnosis not present

## 2017-01-14 DIAGNOSIS — G8929 Other chronic pain: Secondary | ICD-10-CM | POA: Diagnosis not present

## 2017-01-14 DIAGNOSIS — M1711 Unilateral primary osteoarthritis, right knee: Secondary | ICD-10-CM | POA: Diagnosis not present

## 2017-01-16 ENCOUNTER — Encounter: Payer: 59 | Admitting: Internal Medicine

## 2017-03-07 ENCOUNTER — Other Ambulatory Visit: Payer: 59 | Admitting: Internal Medicine

## 2017-03-11 ENCOUNTER — Encounter: Payer: 59 | Admitting: Internal Medicine

## 2017-03-28 DIAGNOSIS — M1712 Unilateral primary osteoarthritis, left knee: Secondary | ICD-10-CM | POA: Diagnosis not present

## 2017-05-16 ENCOUNTER — Other Ambulatory Visit: Payer: 59 | Admitting: Internal Medicine

## 2017-05-16 DIAGNOSIS — E8881 Metabolic syndrome: Secondary | ICD-10-CM

## 2017-05-16 DIAGNOSIS — Z1329 Encounter for screening for other suspected endocrine disorder: Secondary | ICD-10-CM

## 2017-05-16 DIAGNOSIS — I10 Essential (primary) hypertension: Secondary | ICD-10-CM

## 2017-05-16 DIAGNOSIS — E669 Obesity, unspecified: Secondary | ICD-10-CM

## 2017-05-16 DIAGNOSIS — E785 Hyperlipidemia, unspecified: Secondary | ICD-10-CM

## 2017-05-16 DIAGNOSIS — E118 Type 2 diabetes mellitus with unspecified complications: Secondary | ICD-10-CM

## 2017-05-16 DIAGNOSIS — J309 Allergic rhinitis, unspecified: Secondary | ICD-10-CM

## 2017-05-16 DIAGNOSIS — Z1321 Encounter for screening for nutritional disorder: Secondary | ICD-10-CM

## 2017-05-16 DIAGNOSIS — Z0000 Encounter for general adult medical examination without abnormal findings: Secondary | ICD-10-CM

## 2017-05-17 LAB — COMPLETE METABOLIC PANEL WITH GFR
AG RATIO: 1.4 (calc) (ref 1.0–2.5)
ALBUMIN MSPROF: 4 g/dL (ref 3.6–5.1)
ALKALINE PHOSPHATASE (APISO): 88 U/L (ref 33–130)
ALT: 11 U/L (ref 6–29)
AST: 16 U/L (ref 10–35)
BUN: 18 mg/dL (ref 7–25)
CALCIUM: 9.1 mg/dL (ref 8.6–10.4)
CO2: 27 mmol/L (ref 20–32)
CREATININE: 0.85 mg/dL (ref 0.50–1.05)
Chloride: 104 mmol/L (ref 98–110)
GFR, EST NON AFRICAN AMERICAN: 75 mL/min/{1.73_m2} (ref >=60)
GFR, Est African American: 87 mL/min/{1.73_m2} (ref >=60)
GLOBULIN: 2.8 g/dL (ref 1.9–3.7)
Glucose, Bld: 92 mg/dL (ref 65–99)
POTASSIUM: 4.4 mmol/L (ref 3.5–5.3)
SODIUM: 139 mmol/L (ref 135–146)
Total Bilirubin: 0.6 mg/dL (ref 0.2–1.2)
Total Protein: 6.8 g/dL (ref 6.1–8.1)

## 2017-05-17 LAB — CBC WITH DIFFERENTIAL/PLATELET
BASOS ABS: 18 {cells}/uL (ref 0–200)
Basophils Relative: 0.3 %
Eosinophils Absolute: 264 cells/uL (ref 15–500)
Eosinophils Relative: 4.4 %
HEMATOCRIT: 36.1 % (ref 35.0–45.0)
HEMOGLOBIN: 11.2 g/dL — AB (ref 11.7–15.5)
LYMPHS ABS: 1788 {cells}/uL (ref 850–3900)
MCH: 22 pg — ABNORMAL LOW (ref 27.0–33.0)
MCHC: 31 g/dL — AB (ref 32.0–36.0)
MCV: 71.1 fL — AB (ref 80.0–100.0)
MPV: 10.5 fL (ref 7.5–12.5)
Monocytes Relative: 8.5 %
NEUTROS ABS: 3420 {cells}/uL (ref 1500–7800)
NEUTROS PCT: 57 %
Platelets: 347 10*3/uL (ref 140–400)
RBC: 5.08 10*6/uL (ref 3.80–5.10)
RDW: 15.3 % — AB (ref 11.0–15.0)
Total Lymphocyte: 29.8 %
WBC: 6 10*3/uL (ref 3.8–10.8)
WBCMIX: 510 {cells}/uL (ref 200–950)

## 2017-05-17 LAB — TSH: TSH: 1.77 mIU/L (ref 0.40–4.50)

## 2017-05-17 LAB — HEMOGLOBIN A1C
EAG (MMOL/L): 6.6 (calc)
Hgb A1c MFr Bld: 5.8 % of total Hgb — ABNORMAL HIGH (ref <5.7)
MEAN PLASMA GLUCOSE: 120 (calc)

## 2017-05-17 LAB — LIPID PANEL
CHOL/HDL RATIO: 5.4 (calc) — AB (ref <5.0)
CHOLESTEROL: 260 mg/dL — AB (ref <200)
HDL: 48 mg/dL — AB (ref >50)
LDL Cholesterol (Calc): 188 mg/dL (calc) — ABNORMAL HIGH
Non-HDL Cholesterol (Calc): 212 mg/dL (calc) — ABNORMAL HIGH (ref <130)
Triglycerides: 116 mg/dL (ref <150)

## 2017-05-17 LAB — MICROALBUMIN / CREATININE URINE RATIO
Creatinine, Urine: 246 mg/dL (ref 20–275)
MICROALB UR: 0.7 mg/dL
MICROALB/CREAT RATIO: 3 ug/mg{creat} (ref <30)

## 2017-05-17 LAB — VITAMIN D 25 HYDROXY (VIT D DEFICIENCY, FRACTURES): Vit D, 25-Hydroxy: 16 ng/mL — ABNORMAL LOW (ref 30–100)

## 2017-05-19 ENCOUNTER — Encounter: Payer: Self-pay | Admitting: Internal Medicine

## 2017-05-19 ENCOUNTER — Ambulatory Visit (INDEPENDENT_AMBULATORY_CARE_PROVIDER_SITE_OTHER): Payer: 59 | Admitting: Internal Medicine

## 2017-05-19 VITALS — BP 120/90 | HR 89 | Temp 98.7°F | Ht 65.5 in | Wt 244.0 lb

## 2017-05-19 DIAGNOSIS — R7302 Impaired glucose tolerance (oral): Secondary | ICD-10-CM | POA: Insufficient documentation

## 2017-05-19 DIAGNOSIS — I10 Essential (primary) hypertension: Secondary | ICD-10-CM

## 2017-05-19 DIAGNOSIS — E7849 Other hyperlipidemia: Secondary | ICD-10-CM | POA: Diagnosis not present

## 2017-05-19 DIAGNOSIS — Z0000 Encounter for general adult medical examination without abnormal findings: Secondary | ICD-10-CM

## 2017-05-19 DIAGNOSIS — E8881 Metabolic syndrome: Secondary | ICD-10-CM

## 2017-05-19 DIAGNOSIS — E559 Vitamin D deficiency, unspecified: Secondary | ICD-10-CM

## 2017-05-19 DIAGNOSIS — J069 Acute upper respiratory infection, unspecified: Secondary | ICD-10-CM | POA: Diagnosis not present

## 2017-05-19 DIAGNOSIS — Z6839 Body mass index (BMI) 39.0-39.9, adult: Secondary | ICD-10-CM

## 2017-05-19 DIAGNOSIS — K219 Gastro-esophageal reflux disease without esophagitis: Secondary | ICD-10-CM | POA: Diagnosis not present

## 2017-05-19 LAB — POCT URINALYSIS DIPSTICK
BILIRUBIN UA: NEGATIVE
GLUCOSE UA: NEGATIVE
Ketones, UA: NEGATIVE
Leukocytes, UA: NEGATIVE
Nitrite, UA: NEGATIVE
PH UA: 7 (ref 5.0–8.0)
Protein, UA: NEGATIVE
UROBILINOGEN UA: 0.2 U/dL

## 2017-05-19 MED ORDER — AZITHROMYCIN 250 MG PO TABS: ORAL_TABLET | ORAL | 0 refills | Status: DC

## 2017-05-19 MED ORDER — ERGOCALCIFEROL 1.25 MG (50000 UT) PO CAPS: 50000.0000 [IU] | ORAL_CAPSULE | ORAL | 2 refills | Status: DC
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
## 2017-05-19 NOTE — Patient Instructions (Addendum)
Defer flu vaccine due to illness.  Take Zithromax Z-Pak for respiratory infection.  Take Drisdol 50,000 units weekly for 12 weeks then 2000 units vitamin D3 daily for vitamin D deficiency.  Watch diet.  Try to lose weight.  Follow-up in 6 months.  Flu vaccine to be given in a couple of weeks once respiratory infection improves.

## 2017-05-28 DIAGNOSIS — S83221A Peripheral tear of medial meniscus, current injury, right knee, initial encounter: Secondary | ICD-10-CM | POA: Diagnosis not present

## 2017-05-28 DIAGNOSIS — G8918 Other acute postprocedural pain: Secondary | ICD-10-CM | POA: Diagnosis not present

## 2017-05-28 DIAGNOSIS — S83261A Peripheral tear of lateral meniscus, current injury, right knee, initial encounter: Secondary | ICD-10-CM | POA: Diagnosis not present

## 2017-05-28 DIAGNOSIS — M659 Synovitis and tenosynovitis, unspecified: Secondary | ICD-10-CM | POA: Diagnosis not present

## 2017-06-05 DIAGNOSIS — M25561 Pain in right knee: Secondary | ICD-10-CM | POA: Diagnosis not present

## 2017-06-05 DIAGNOSIS — M25661 Stiffness of right knee, not elsewhere classified: Secondary | ICD-10-CM | POA: Diagnosis not present

## 2017-06-05 DIAGNOSIS — Z9889 Other specified postprocedural states: Secondary | ICD-10-CM | POA: Diagnosis not present

## 2017-06-12 DIAGNOSIS — M25561 Pain in right knee: Secondary | ICD-10-CM | POA: Diagnosis not present

## 2017-06-12 DIAGNOSIS — Z9889 Other specified postprocedural states: Secondary | ICD-10-CM | POA: Diagnosis not present

## 2017-06-12 DIAGNOSIS — M25661 Stiffness of right knee, not elsewhere classified: Secondary | ICD-10-CM | POA: Diagnosis not present

## 2017-06-17 DIAGNOSIS — Z9889 Other specified postprocedural states: Secondary | ICD-10-CM | POA: Diagnosis not present

## 2017-06-17 DIAGNOSIS — M25561 Pain in right knee: Secondary | ICD-10-CM | POA: Diagnosis not present

## 2017-06-17 DIAGNOSIS — M25661 Stiffness of right knee, not elsewhere classified: Secondary | ICD-10-CM | POA: Diagnosis not present

## 2017-06-23 DIAGNOSIS — M25561 Pain in right knee: Secondary | ICD-10-CM | POA: Diagnosis not present

## 2017-06-23 DIAGNOSIS — M25661 Stiffness of right knee, not elsewhere classified: Secondary | ICD-10-CM | POA: Diagnosis not present

## 2017-06-23 DIAGNOSIS — Z9889 Other specified postprocedural states: Secondary | ICD-10-CM | POA: Diagnosis not present

## 2017-08-11 ENCOUNTER — Other Ambulatory Visit: Payer: Self-pay | Admitting: Internal Medicine

## 2017-08-11 DIAGNOSIS — E785 Hyperlipidemia, unspecified: Secondary | ICD-10-CM

## 2017-08-11 DIAGNOSIS — R7302 Impaired glucose tolerance (oral): Secondary | ICD-10-CM

## 2017-08-22 ENCOUNTER — Other Ambulatory Visit: Payer: 59 | Admitting: Internal Medicine

## 2017-08-25 ENCOUNTER — Ambulatory Visit: Payer: 59 | Admitting: Internal Medicine

## 2017-08-28 ENCOUNTER — Other Ambulatory Visit: Payer: 59 | Admitting: Internal Medicine

## 2017-08-28 DIAGNOSIS — E785 Hyperlipidemia, unspecified: Secondary | ICD-10-CM | POA: Diagnosis not present

## 2017-08-28 DIAGNOSIS — R7302 Impaired glucose tolerance (oral): Secondary | ICD-10-CM

## 2017-08-29 ENCOUNTER — Ambulatory Visit: Payer: 59 | Admitting: Internal Medicine

## 2017-08-29 LAB — HEMOGLOBIN A1C
EAG (MMOL/L): 6.8 (calc)
HEMOGLOBIN A1C: 5.9 %{Hb} — AB (ref <5.7)
MEAN PLASMA GLUCOSE: 123 (calc)

## 2017-08-29 LAB — LIPID PANEL
CHOLESTEROL: 206 mg/dL — AB (ref <200)
HDL: 38 mg/dL — ABNORMAL LOW (ref >50)
LDL Cholesterol (Calc): 142 mg/dL (calc) — ABNORMAL HIGH
Non-HDL Cholesterol (Calc): 168 mg/dL (calc) — ABNORMAL HIGH (ref <130)
Total CHOL/HDL Ratio: 5.4 (calc) — ABNORMAL HIGH (ref <5.0)
Triglycerides: 133 mg/dL (ref <150)

## 2017-08-29 LAB — MICROALBUMIN / CREATININE URINE RATIO
CREATININE, URINE: 178 mg/dL (ref 20–275)
MICROALB UR: 0.7 mg/dL
Microalb Creat Ratio: 4 mcg/mg creat (ref <30)

## 2017-09-01 ENCOUNTER — Ambulatory Visit: Payer: 59 | Admitting: Internal Medicine

## 2017-09-05 ENCOUNTER — Ambulatory Visit: Payer: 59 | Admitting: Internal Medicine

## 2017-09-05 ENCOUNTER — Encounter: Payer: Self-pay | Admitting: Internal Medicine

## 2017-09-05 VITALS — BP 120/88 | HR 77 | Ht 65.5 in | Wt 244.0 lb

## 2017-09-05 DIAGNOSIS — R609 Edema, unspecified: Secondary | ICD-10-CM | POA: Diagnosis not present

## 2017-09-05 DIAGNOSIS — E7849 Other hyperlipidemia: Secondary | ICD-10-CM | POA: Diagnosis not present

## 2017-09-05 DIAGNOSIS — R7302 Impaired glucose tolerance (oral): Secondary | ICD-10-CM | POA: Diagnosis not present

## 2017-09-05 DIAGNOSIS — E559 Vitamin D deficiency, unspecified: Secondary | ICD-10-CM | POA: Diagnosis not present

## 2017-09-05 DIAGNOSIS — J069 Acute upper respiratory infection, unspecified: Secondary | ICD-10-CM

## 2017-09-05 DIAGNOSIS — I10 Essential (primary) hypertension: Secondary | ICD-10-CM | POA: Diagnosis not present

## 2017-09-05 MED ORDER — AZITHROMYCIN 250 MG PO TABS: ORAL_TABLET | ORAL | 0 refills | Status: DC

## 2017-09-05 MED ORDER — FUROSEMIDE 20 MG PO TABS: 20.0000 mg | ORAL_TABLET | Freq: Every day | ORAL | 3 refills | Status: DC

## 2017-09-05 NOTE — Patient Instructions (Addendum)
Take 5000 daily Vitamin D3. Lasix 20 mg daily. Zithromax Z pak  2 po day 1 followed by one po days 2-5.

## 2017-09-07 NOTE — Progress Notes (Signed)
   Subjective:    Patient ID: Barbara [name], female    DOB: 05/29/1956, 62 y.o.   MRN: [id number]  HPI Here for blood pressure check, dependent edema, vitamin D deficiency and respiratory infection.  At last visit in October diastolic was 90.  Today it is 8488.  Systolic is fine at 120.  She has impaired glucose tolerance and hyperlipidemia.  Cholesterol has improved from 260-206 and LDL has improved from 188-142.  Hemoglobin A1c is 5.9%.  Previously was 5.8%.  Currently not on statin medication.  She is on her feet a lot at work.  She is noticed some edema.  In October her vitamin D level was 16 and she took 50,000 units weekly of D3.  Now needs to take 5000 units vitamin D3 daily.  She has not felt well for the past few days has had respiratory infection ,malaise and fatigue.  Has congestion.  No documented fever.  Review of Systems see above     Objective:   Physical Exam Neck is supple without adenopathy.  Chest clear to auscultation.  Cardiac exam regular rate and rhythm.  She does have 1+ pitting edema of the lower extremities.  Pharynx is clear.  TMs are clear.       Assessment & Plan:  Impaired glucose tolerance  Improvement in hyperlipidemia with diet  Dependent edema-start Lasix 20 mg daily  URI with discolored sputum.  Treat with Zithromax Z-PAK Plan: Vitamin D deficiency-take 5000 units vitamin D3 daily since she had high-dose vitamin D at last visit for 12 weeks.  She will follow-up in March with office visit, basic metabolic panel and vitamin D level for  At next visit, asked her to consider statin therapy.

## 2017-09-11 DIAGNOSIS — S92911A Unspecified fracture of right toe(s), initial encounter for closed fracture: Secondary | ICD-10-CM | POA: Diagnosis not present

## 2017-09-25 ENCOUNTER — Ambulatory Visit: Payer: 59 | Admitting: Podiatry

## 2017-10-09 DIAGNOSIS — M19071 Primary osteoarthritis, right ankle and foot: Secondary | ICD-10-CM | POA: Diagnosis not present

## 2017-10-09 DIAGNOSIS — M94261 Chondromalacia, right knee: Secondary | ICD-10-CM | POA: Diagnosis not present

## 2017-10-09 DIAGNOSIS — M1711 Unilateral primary osteoarthritis, right knee: Secondary | ICD-10-CM | POA: Diagnosis not present

## 2017-10-09 DIAGNOSIS — S92534A Nondisplaced fracture of distal phalanx of right lesser toe(s), initial encounter for closed fracture: Secondary | ICD-10-CM | POA: Diagnosis not present

## 2017-10-13 ENCOUNTER — Ambulatory Visit: Payer: 59 | Admitting: Podiatry

## 2017-10-20 DIAGNOSIS — Z01419 Encounter for gynecological examination (general) (routine) without abnormal findings: Secondary | ICD-10-CM | POA: Diagnosis not present

## 2017-10-23 DIAGNOSIS — M1712 Unilateral primary osteoarthritis, left knee: Secondary | ICD-10-CM | POA: Diagnosis not present

## 2017-10-30 ENCOUNTER — Other Ambulatory Visit: Payer: 59 | Admitting: Internal Medicine

## 2017-11-03 ENCOUNTER — Ambulatory Visit: Payer: 59 | Admitting: Internal Medicine

## 2017-11-18 ENCOUNTER — Other Ambulatory Visit: Payer: Self-pay

## 2017-11-18 DIAGNOSIS — D649 Anemia, unspecified: Secondary | ICD-10-CM

## 2017-11-18 DIAGNOSIS — E559 Vitamin D deficiency, unspecified: Secondary | ICD-10-CM

## 2017-12-05 ENCOUNTER — Other Ambulatory Visit: Payer: 59 | Admitting: Internal Medicine

## 2017-12-05 DIAGNOSIS — D649 Anemia, unspecified: Secondary | ICD-10-CM

## 2017-12-05 DIAGNOSIS — E559 Vitamin D deficiency, unspecified: Secondary | ICD-10-CM

## 2017-12-05 LAB — BASIC METABOLIC PANEL
BUN: 22 mg/dL (ref 7–25)
CHLORIDE: 108 mmol/L (ref 98–110)
CO2: 26 mmol/L (ref 20–32)
CREATININE: 0.72 mg/dL (ref 0.50–0.99)
Calcium: 9.3 mg/dL (ref 8.6–10.4)
Glucose, Bld: 91 mg/dL (ref 65–99)
POTASSIUM: 4.7 mmol/L (ref 3.5–5.3)
SODIUM: 143 mmol/L (ref 135–146)

## 2017-12-06 LAB — VITAMIN D 25 HYDROXY (VIT D DEFICIENCY, FRACTURES): VIT D 25 HYDROXY: 25 ng/mL — AB (ref 30–100)

## 2017-12-08 ENCOUNTER — Encounter: Payer: Self-pay | Admitting: Internal Medicine

## 2017-12-08 ENCOUNTER — Ambulatory Visit: Payer: 59 | Admitting: Internal Medicine

## 2017-12-08 VITALS — BP 130/90 | HR 66 | Temp 97.9°F | Ht 65.5 in | Wt 245.0 lb

## 2017-12-08 DIAGNOSIS — R609 Edema, unspecified: Secondary | ICD-10-CM

## 2017-12-08 DIAGNOSIS — E559 Vitamin D deficiency, unspecified: Secondary | ICD-10-CM

## 2017-12-08 DIAGNOSIS — I10 Essential (primary) hypertension: Secondary | ICD-10-CM | POA: Diagnosis not present

## 2017-12-08 NOTE — Patient Instructions (Signed)
Take 5000 units vitamin D3 daily.  Continue to take diuretic as needed and follow-up here in August.

## 2017-12-08 NOTE — Progress Notes (Signed)
   Subjective:    Patient ID: Barbara [name], female    DOB: May 12, 1956, 62 y.o.   MRN: [id number]  HPI She had recent vitamin D level after taking 5000 units every other day of vitamin D3.  She is on Lasix as needed for dependent edema.  Basic metabolic panel is normal.  Vitamin D level is 25 and previously was 16.  I suggested she take 5000 units vitamin D3 daily and we will follow-up with this at her 66-month recheck in August.  She is feeling well with no new complaints.  Her blood pressure is stable.  She is not on antihypertensive medication.  We stopped that.  I suggested she get a home blood pressure monitor.  She checked it at [facility] and it was 140/90 but I do not think that was necessarily accurate.    Review of Systems     Objective:   Physical Exam Recheck blood pressure here in office today and got 120/90       Assessment & Plan:  Dependent edema  Vitamin D deficiency  Mild anemia with thalassemia trait  Plan: She will return in August for follow-up.  That will be her 30-month recheck.

## 2018-02-04 IMAGING — MR MR KNEE*R* W/O CM
4 of 6 series · 22 of 40 positions shown · non-contrast
Comparison: Plain films right knee 03/12/2014.

CLINICAL DATA: Right knee pain for 1.5 years which has worsened
over the past 8 months. Pain is predominantly medial and radiates
into patella. Knee stiffness. No known injury.

EXAM:
MRI OF THE RIGHT KNEE WITHOUT CONTRAST
TECHNIQUE: Multiplanar, multisequence MR imaging of the knee was performed. No
intravenous contrast was administered.

[Series 3: PD fat-sat · axial · 4.0mm · 0.44mm/px · z∈[-87,+42]mm · 7 of 28 slices shown (1 of 3)]
[im 1/28]
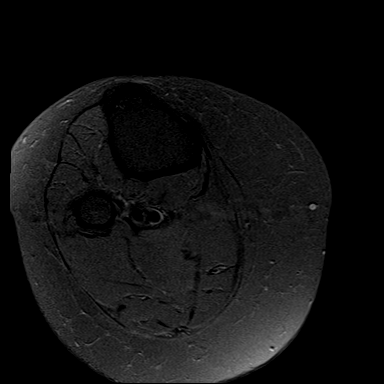
[im 5/28]
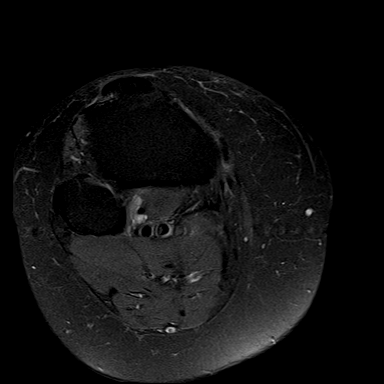
[im 10/28]
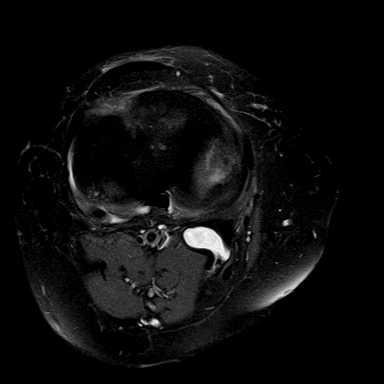
[im 14/28]
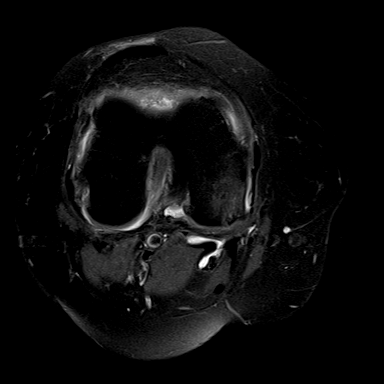
[im 19/28]
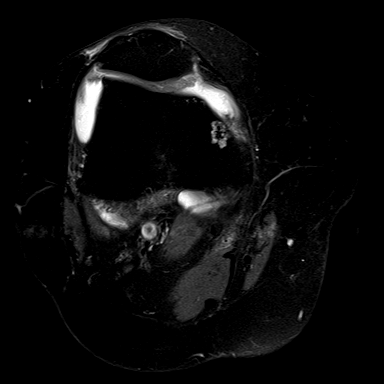
[im 23/28]
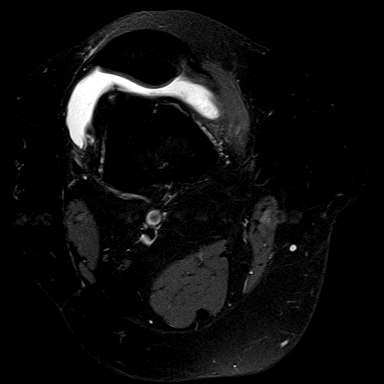
[im 28/28]
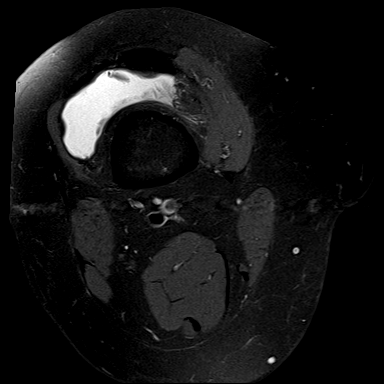

[Series 5: T2 fat-sat · coronal · 3.0mm · 0.29mm/px · 3 of 28 slices shown]
[im 4/28]
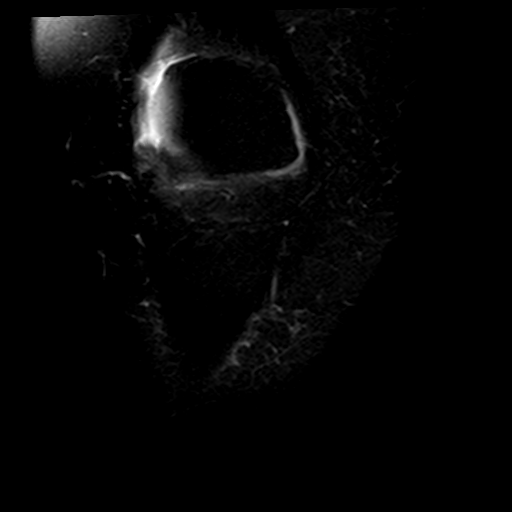
[im 16/28]
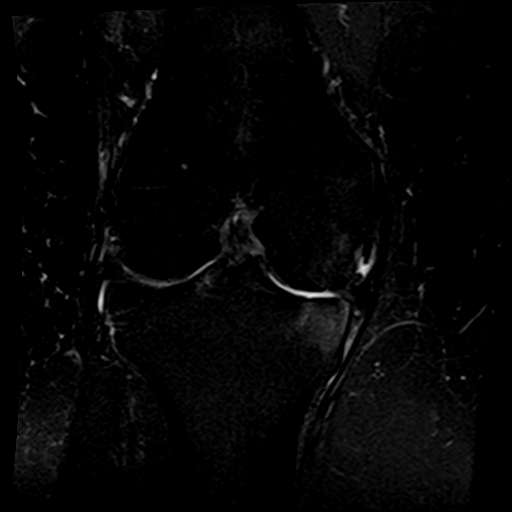
[im 24/28]
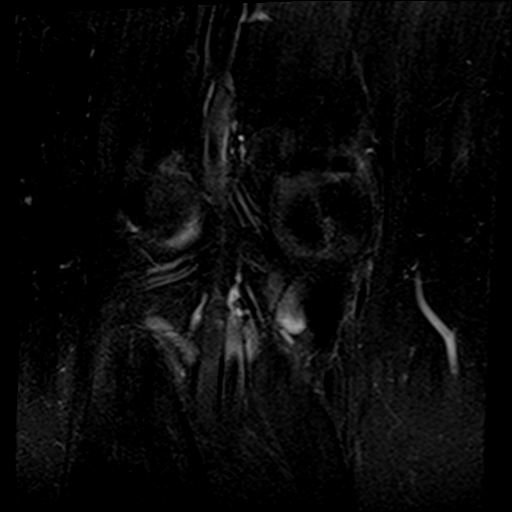

[Series 6: PD fat-sat · coronal · 3.0mm · 0.29mm/px · 8 of 28 slices shown (2 of 3)]
[im 1/28]
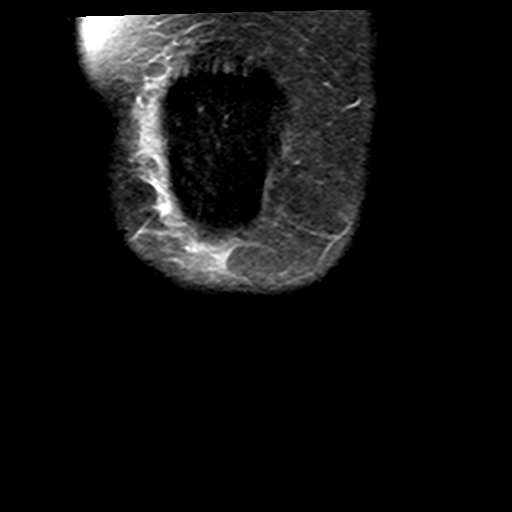
[im 4/28]
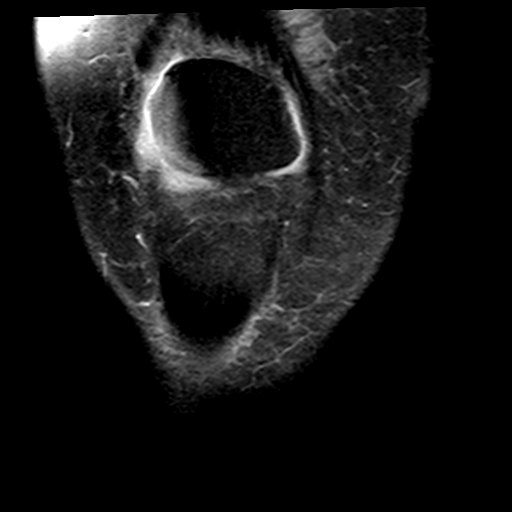
[im 8/28]
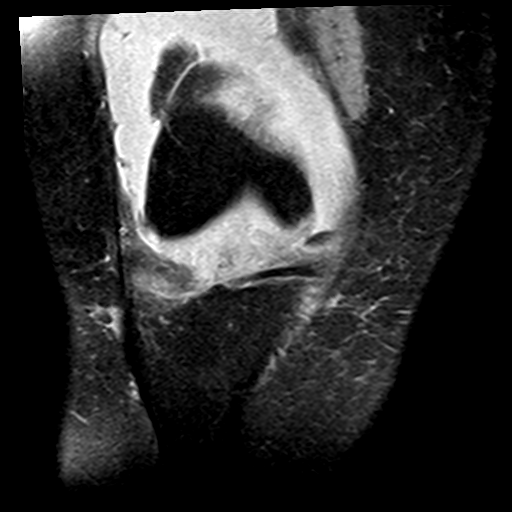
[im 12/28]
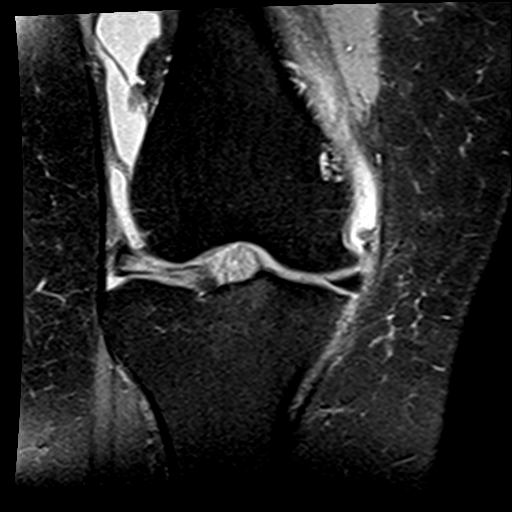
[im 16/28]
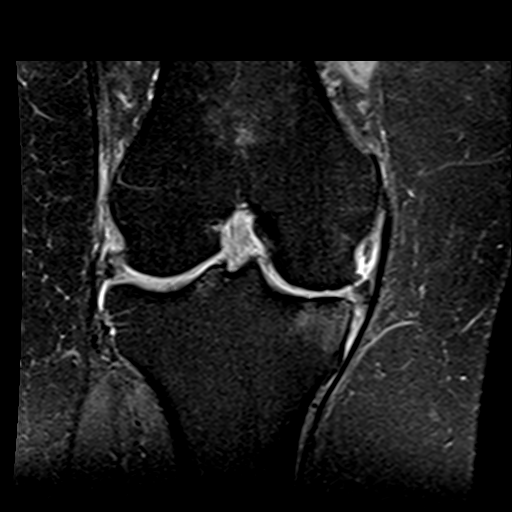
[im 20/28]
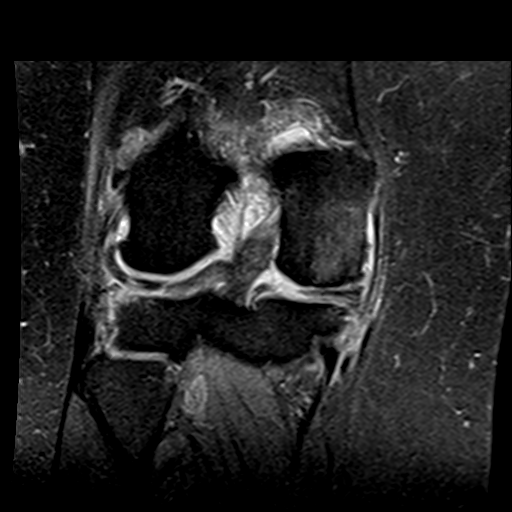
[im 24/28]
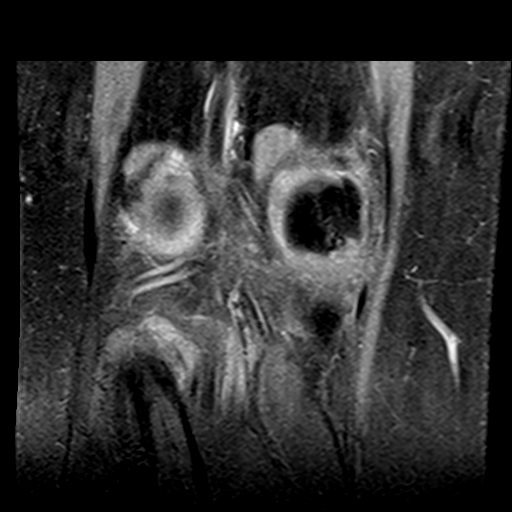
[im 28/28]
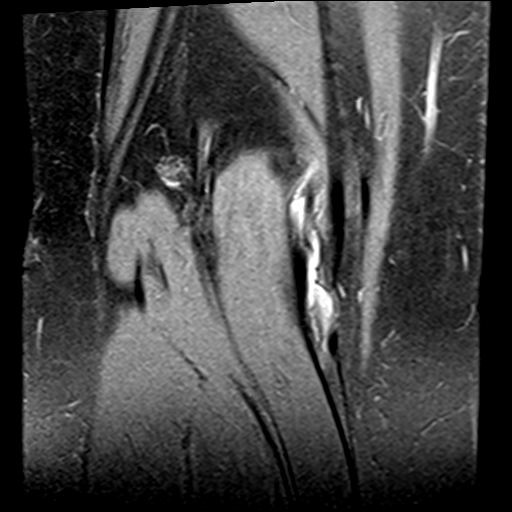

[Series 7: PD fat-sat · sagittal · 4.0mm · 0.31mm/px · 4 of 21 slices shown (3 of 3)]
[im 1/21]
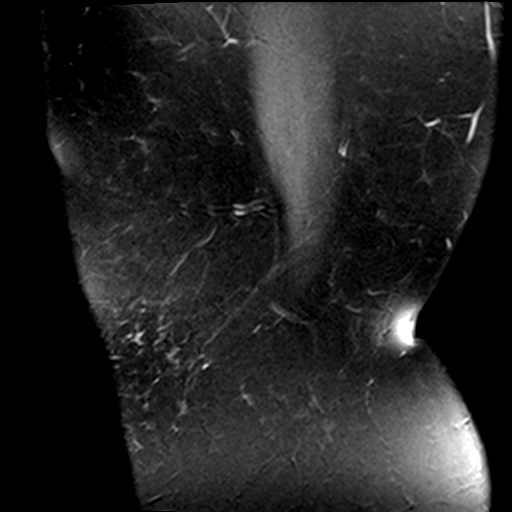
[im 5/21]
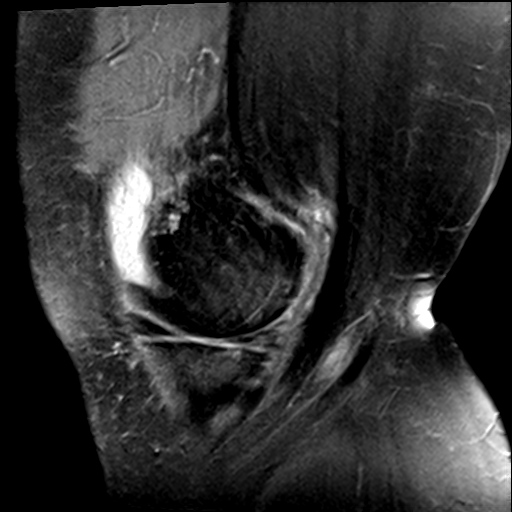
[im 13/21]
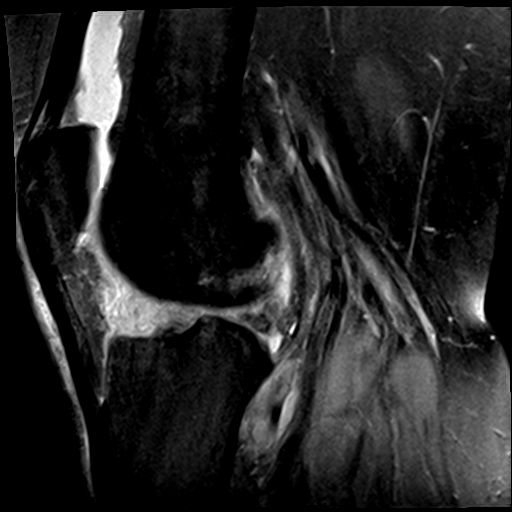
[im 21/21]
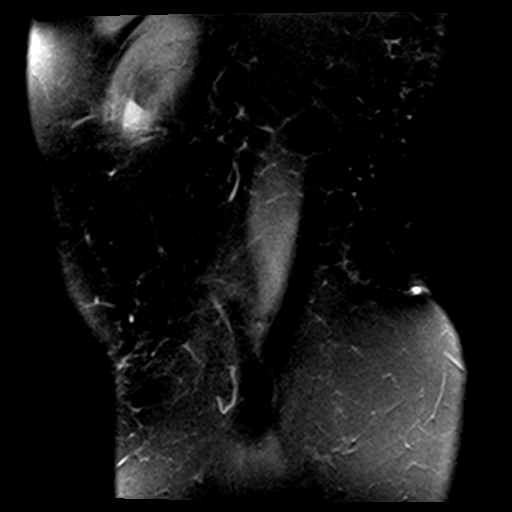

[22 of 40 positions shown; findings below may reference images not displayed]

FINDINGS: MENISCI

Medial meniscus: Extensive complex tearing is seen in a degenerated
posterior horn. The body is degenerated and extruded peripherally
with a horizontal tear in the posterior body.

Lateral meniscus: Severely degenerated with tearing throughout.
Tearing in the posterior horn and body includes both horizontal and
radial components. In the anterior horn, the tear is horizontal
reaching the meniscal undersurface.

LIGAMENTS

Cruciates: Intact. The ACL is thickened with extensive
intrasubstance increased T2 signal consistent with marked mucoid
degeneration.

Collaterals:  Intact.

CARTILAGE

Patellofemoral:  Minimally degenerated.

Medial: Thinned throughout with associated joint space narrowing. No
focal defect.

Lateral:  Thinned and irregular without focal defect.

Joint:  Moderate to moderately large effusion.

Popliteal Fossa: Baker's cyst measures approximately 1.9 cm AP by
1.2 cm transverse popped 1.2 cm craniocaudal.

Extensor Mechanism:  Intact.

Bones: Mild to moderate subchondral edema and small osteophytes are
seen about the medial compartment. No fracture or worrisome lesion.
Chondroid lesion in the medial metaphysis of the femur most
consistent with an enchondroma measures 1.3 cm craniocaudal by
cm transverse by 1.2 cm AP.

Other: None.
IMPRESSION: Extensive complex tearing of both the medial and lateral menisci as
described above.

Osteoarthritis about the knee appearing worst in the medial
compartment where it is advanced.

Small Baker's cyst.

Severe mucoid degeneration of the ACL without tear.

## 2018-02-16 ENCOUNTER — Ambulatory Visit: Payer: 59 | Admitting: Podiatry

## 2018-02-16 ENCOUNTER — Ambulatory Visit (INDEPENDENT_AMBULATORY_CARE_PROVIDER_SITE_OTHER): Payer: 59

## 2018-02-16 ENCOUNTER — Encounter: Payer: Self-pay | Admitting: Podiatry

## 2018-02-16 VITALS — BP 135/80 | HR 62 | Temp 98.9°F | Resp 16

## 2018-02-16 DIAGNOSIS — M216X9 Other acquired deformities of unspecified foot: Secondary | ICD-10-CM | POA: Diagnosis not present

## 2018-02-16 DIAGNOSIS — M722 Plantar fascial fibromatosis: Secondary | ICD-10-CM

## 2018-02-16 NOTE — Progress Notes (Signed)
   Subjective:    Patient ID: Barbara [name], female    DOB: 02/08/1956, 62 y.o.   MRN: [id number]  HPI    Review of Systems  Musculoskeletal: Positive for arthralgias, joint swelling and myalgias.  All other systems reviewed and are negative.      Objective:   Physical Exam        Assessment & Plan:

## 2018-02-16 NOTE — Patient Instructions (Signed)

## 2018-02-17 ENCOUNTER — Telehealth: Payer: Self-pay | Admitting: Podiatry

## 2018-02-17 MED ORDER — MELOXICAM 15 MG PO TABS: 15.0000 mg | ORAL_TABLET | Freq: Every day | ORAL | 0 refills | Status: DC

## 2018-02-17 NOTE — Telephone Encounter (Signed)
I informed pt the Meloxicam had been sent to the [facility] by Dr. Samuella CotaPrice today.

## 2018-02-17 NOTE — Telephone Encounter (Signed)
Dr. Samuella CotaPrice did not call in the antiinflammatory medication yesterday to [facility] [address]. If somebody could call that in then call and let me know it was called in I would greatly appreciate it. My number is [phone].

## 2018-02-17 NOTE — Telephone Encounter (Signed)
Completed on another Telephone Call message. 

## 2018-02-17 NOTE — Telephone Encounter (Signed)
Pt never received medication that was suppose to be sent to Pharmacy.

## 2018-02-17 NOTE — Telephone Encounter (Signed)
Message completed on another Telephone Call.

## 2018-02-17 NOTE — Telephone Encounter (Signed)
Patient called again about medication that wasn't sent to the pharmacy. "Please call patient back at [phone]

## 2018-02-25 ENCOUNTER — Other Ambulatory Visit: Payer: Self-pay | Admitting: Podiatry

## 2018-02-25 DIAGNOSIS — M722 Plantar fascial fibromatosis: Secondary | ICD-10-CM

## 2018-03-03 NOTE — Progress Notes (Signed)
  Subjective:  Patient ID: Barbara [name], female    DOB: 05/15/1956,  MRN: [id number]  Chief Complaint  Patient presents with  . Foot Pain    R bottom heel x 1 mo; 7/10 sharp pain -no injury Tx: advil -pt stated," it's worst in AM."    62 y.o. female presents for heel pain.  Reports pain in the bottom of the right heel for the last month.  710 sharp intermittent pain denies injury.  Has been trying Advil.  Worse in the morning Past Medical History:  Diagnosis Date  . Allergy    allergic rhinitis  . Anemia   . Arthritis    l k nee osteo  . Diabetes mellitus without complication (HCC)    diet controlled  . Hyperlipidemia   . Leg swelling    a. RLE edema, intermittent, 2 negative duplexes in 2015.  Marland Kitchen. Low back pain   . Obesity    Past Surgical History:  Procedure Laterality Date  . ABDOMINAL HYSTERECTOMY    . BREAST SURGERY     reduction  . LIGAMENT REPAIR Right 05/24/2014   Procedure:  RECONSTRUCTION RADIAL COLLATERAL LIGAMENT METACARPAL PHALANGEAL JOINT RIGHT MIDDLE FINGER WITH PALMARIS LONGUS GRAFT;  Surgeon: Cindee SaltGary Kuzma, MD;  Location: [facility];  Service: Orthopedics;  Laterality: Right;  . MYOMECTOMY      Current Outpatient Medications:  .  aspirin 81 MG EC tablet, Take 1 tablet (81 mg total) by mouth daily. Swallow whole., Disp: , Rfl:  .  cholecalciferol (VITAMIN D) 1000 units tablet, Take 5,000 Units by mouth daily. , Disp: , Rfl:  .  furosemide (LASIX) 20 MG tablet, Take 1 tablet (20 mg total) by mouth daily., Disp: 30 tablet, Rfl: 3 .  Multiple Vitamin (MULTIVITAMIN) tablet, Take 1 tablet by mouth daily., Disp: , Rfl:  .  meloxicam (MOBIC) 15 MG tablet, Take 1 tablet (15 mg total) by mouth daily., Disp: 30 tablet, Rfl: 0  Allergies  Allergen Reactions  . Sulfamethoxazole Hives  . Sulfonamide Derivatives     REACTION: hives     Objective:   Vitals:   02/16/18 1045  BP: 135/80  Pulse: 62  Resp: 16  Temp: 98.9 F (37.2 C)   General AA&O  x3. Normal mood and affect.  Vascular Pedal pulses palpable.  Neurologic Epicritic sensation grossly intact.  Dermatologic No open lesions. Skin normal texture and turgor.  Orthopedic: Pain to palpation medial calcaneal tuber right.  Decreased ankle joint range of motion with positive Silverskiold test   Assessment & Plan:  Patient was evaluated and treated and all questions answered.  Plantar Fasciitis, right - XR reviewed as above.  - Educated on icing and stretching. Instructions given.  - Injection delivered to the plantar fascia as below. - Night splint dispensed. - Rx Meloxicam  Procedure: Injection Tendon/Ligament Location: Right plantar fascia at the glabrous junction; medial approach. Skin Prep: Alcohol. Injectate: 1 cc 0.5% marcaine plain, 1 cc dexamethasone phosphate, 0.5 cc kenalog 10. Disposition: Patient tolerated procedure well. Injection site dressed with a band-aid.  Return in about 3 weeks (around 03/09/2018) for Plantar fasciitis, Right.

## 2018-03-13 ENCOUNTER — Ambulatory Visit: Payer: 59 | Admitting: Podiatry

## 2018-03-20 ENCOUNTER — Ambulatory Visit: Payer: 59 | Admitting: Podiatry

## 2018-03-27 ENCOUNTER — Other Ambulatory Visit: Payer: 59 | Admitting: Internal Medicine

## 2018-03-30 ENCOUNTER — Ambulatory Visit: Payer: 59 | Admitting: Internal Medicine

## 2018-04-03 ENCOUNTER — Ambulatory Visit: Payer: 59 | Admitting: Podiatry

## 2018-04-03 DIAGNOSIS — M722 Plantar fascial fibromatosis: Secondary | ICD-10-CM

## 2018-04-03 NOTE — Progress Notes (Signed)
  Subjective:  Patient ID: Barbara [name], female    DOB: 09/02/1955,  MRN: [id number]  Chief Complaint  Patient presents with  . Plantar Fasciitis    right - 6 week follow up    62 y.o. female presents with the above complaint. Heel is still hurting but it is much improved. Still doing her exercises.  Review of Systems: Negative except as noted in the HPI. Denies N/V/F/Ch.  Past Medical History:  Diagnosis Date  . Allergy    allergic rhinitis  . Anemia   . Arthritis    l k nee osteo  . Diabetes mellitus without complication (HCC)    diet controlled  . Hyperlipidemia   . Leg swelling    a. RLE edema, intermittent, 2 negative duplexes in 2015.  Marland Kitchen. Low back pain   . Obesity     Current Outpatient Medications:  .  aspirin 81 MG EC tablet, Take 1 tablet (81 mg total) by mouth daily. Swallow whole., Disp: , Rfl:  .  cholecalciferol (VITAMIN D) 1000 units tablet, Take 5,000 Units by mouth daily. , Disp: , Rfl:  .  furosemide (LASIX) 20 MG tablet, Take 1 tablet (20 mg total) by mouth daily., Disp: 30 tablet, Rfl: 3 .  meloxicam (MOBIC) 15 MG tablet, Take 1 tablet (15 mg total) by mouth daily., Disp: 30 tablet, Rfl: 0 .  Multiple Vitamin (MULTIVITAMIN) tablet, Take 1 tablet by mouth daily., Disp: , Rfl:   Social History   Tobacco Use  Smoking Status Never Smoker  Smokeless Tobacco Never Used    Allergies  Allergen Reactions  . Sulfamethoxazole Hives  . Sulfonamide Derivatives     REACTION: hives   Objective:  There were no vitals filed for this visit. There is no height or weight on file to calculate BMI. Constitutional Well developed. Well nourished.  Vascular Dorsalis pedis pulses palpable bilaterally. Posterior tibial pulses palpable bilaterally. Capillary refill normal to all digits.  No cyanosis or clubbing noted. Pedal hair growth normal.  Neurologic Normal speech. Oriented to person, place, and time. Epicritic sensation to light touch grossly present  bilaterally.  Dermatologic Nails well groomed and normal in appearance. No open wounds. No skin lesions.  Orthopedic: Normal joint ROM without pain or crepitus bilaterally. No visible deformities. Tender to palpation at the calcaneal tuber right. No pain with calcaneal squeeze right. Ankle ROM diminished range of motion right. Silfverskiold Test: positive right.   Radiographs: None today Assessment:   1. Plantar fasciitis    Plan:  Patient was evaluated and treated and all questions answered.  Plantar Fasciitis, right - Continue stretching. - Injection delivered to the plantar fascia as below.  Procedure: Injection Tendon/Ligament Location: Right plantar fascia at the glabrous junction; medial approach. Skin Prep: alcohol Injectate: 1 cc 0.5% marcaine plain, 1 cc dexamethasone phosphate, 0.5 cc kenalog 10. Disposition: Patient tolerated procedure well. Injection site dressed with a band-aid.  Return if symptoms worsen or fail to improve.

## 2018-04-10 DIAGNOSIS — Z9889 Other specified postprocedural states: Secondary | ICD-10-CM | POA: Diagnosis not present

## 2018-04-10 DIAGNOSIS — M1711 Unilateral primary osteoarthritis, right knee: Secondary | ICD-10-CM | POA: Diagnosis not present

## 2018-04-24 ENCOUNTER — Other Ambulatory Visit: Payer: 59 | Admitting: Internal Medicine

## 2018-04-24 ENCOUNTER — Other Ambulatory Visit: Payer: Self-pay | Admitting: Internal Medicine

## 2018-04-24 DIAGNOSIS — E7849 Other hyperlipidemia: Secondary | ICD-10-CM

## 2018-04-24 DIAGNOSIS — R7302 Impaired glucose tolerance (oral): Secondary | ICD-10-CM | POA: Diagnosis not present

## 2018-04-24 DIAGNOSIS — I10 Essential (primary) hypertension: Secondary | ICD-10-CM | POA: Diagnosis not present

## 2018-04-24 DIAGNOSIS — M1712 Unilateral primary osteoarthritis, left knee: Secondary | ICD-10-CM | POA: Diagnosis not present

## 2018-04-25 LAB — BASIC METABOLIC PANEL
BUN: 24 mg/dL (ref 7–25)
CALCIUM: 9.6 mg/dL (ref 8.6–10.4)
CO2: 26 mmol/L (ref 20–32)
Chloride: 103 mmol/L (ref 98–110)
Creat: 0.86 mg/dL (ref 0.50–0.99)
GLUCOSE: 91 mg/dL (ref 65–99)
Potassium: 4.3 mmol/L (ref 3.5–5.3)
Sodium: 139 mmol/L (ref 135–146)

## 2018-04-25 LAB — LIPID PANEL
Cholesterol: 249 mg/dL — ABNORMAL HIGH (ref <200)
HDL: 50 mg/dL — ABNORMAL LOW (ref >50)
LDL CHOLESTEROL (CALC): 177 mg/dL — AB
NON-HDL CHOLESTEROL (CALC): 199 mg/dL — AB (ref <130)
TRIGLYCERIDES: 98 mg/dL (ref <150)
Total CHOL/HDL Ratio: 5 (calc) — ABNORMAL HIGH (ref <5.0)

## 2018-04-25 LAB — HEMOGLOBIN A1C
HEMOGLOBIN A1C: 6 %{Hb} — AB (ref <5.7)
MEAN PLASMA GLUCOSE: 126 (calc)
eAG (mmol/L): 7 (calc)

## 2018-04-25 LAB — MICROALBUMIN / CREATININE URINE RATIO
CREATININE, URINE: 187 mg/dL (ref 20–275)
MICROALB UR: 0.6 mg/dL
MICROALB/CREAT RATIO: 3 ug/mg{creat} (ref <30)

## 2018-04-27 ENCOUNTER — Ambulatory Visit: Payer: 59 | Admitting: Internal Medicine

## 2018-04-27 ENCOUNTER — Other Ambulatory Visit: Payer: Self-pay

## 2018-04-27 ENCOUNTER — Encounter: Payer: Self-pay | Admitting: Internal Medicine

## 2018-04-27 VITALS — BP 130/82 | HR 74 | Ht 65.5 in | Wt 247.0 lb

## 2018-04-27 DIAGNOSIS — R7302 Impaired glucose tolerance (oral): Secondary | ICD-10-CM

## 2018-04-27 DIAGNOSIS — E8881 Metabolic syndrome: Secondary | ICD-10-CM | POA: Diagnosis not present

## 2018-04-27 DIAGNOSIS — R609 Edema, unspecified: Secondary | ICD-10-CM

## 2018-04-27 DIAGNOSIS — E7800 Pure hypercholesterolemia, unspecified: Secondary | ICD-10-CM

## 2018-04-27 DIAGNOSIS — M7542 Impingement syndrome of left shoulder: Secondary | ICD-10-CM

## 2018-04-27 DIAGNOSIS — Z6841 Body Mass Index (BMI) 40.0 and over, adult: Secondary | ICD-10-CM

## 2018-04-27 MED ORDER — PHENTERMINE HCL 30 MG PO CAPS: 30.0000 mg | ORAL_CAPSULE | ORAL | 0 refills | Status: DC

## 2018-04-27 MED ORDER — METFORMIN HCL 500 MG PO TABS: 500.0000 mg | ORAL_TABLET | Freq: Every day | ORAL | 1 refills | Status: DC

## 2018-04-27 NOTE — Patient Instructions (Addendum)
See Dr. Sherlean FootLucey regarding left shoulder impingement.  90 days of phentermine 30 mg daily given appetite suppression.  She declines lipid-lowering medication, flu vaccine and Prevnar 13.  She is to check with [facility] regarding date of colonoscopy and return visit.  She will start metformin 500 mg daily.  She refuses Crestor.  Follow-up in 6 months.  She declines visit to dietitian.
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
## 2018-05-11 ENCOUNTER — Telehealth: Payer: Self-pay | Admitting: Internal Medicine

## 2018-05-11 MED ORDER — LOSARTAN POTASSIUM 25 MG PO TABS: 25.0000 mg | ORAL_TABLET | Freq: Every day | ORAL | 0 refills | Status: DC

## 2018-05-11 NOTE — Telephone Encounter (Signed)
Restart Cozaar 25 mg daily. Continue furosemide. Stop Phentermine for weight loss and call if not coming down in next 2 days.Call in 30 days of Cozaar 25 mg daily please.

## 2018-05-11 NOTE — Telephone Encounter (Signed)
Pt was notified of results and instructions, pt verbalized understanding.  Cozaar was e-scribed, pt was notified to call in 2 days if BP is elevated.

## 2018-05-11 NOTE — Telephone Encounter (Signed)
Patient calls stating that she has had a bad headache the past 2 days.  She took her BP and it has been  Saturday:  150/87 Sunday:  148/87  She says that she feels that perhaps maybe she got upset over something, but she went to take her old BP medication that you had her on, but she was out.  You last had her on Cozaar 25mg  daily.  You stopped that on 05/19/17.  She is wanting that to be called in.  Do you want to see her for an OV before calling this in for her?

## 2018-06-04 ENCOUNTER — Other Ambulatory Visit: Payer: Self-pay | Admitting: Internal Medicine

## 2018-07-08 ENCOUNTER — Encounter: Payer: Self-pay | Admitting: Internal Medicine

## 2018-07-08 ENCOUNTER — Ambulatory Visit (INDEPENDENT_AMBULATORY_CARE_PROVIDER_SITE_OTHER): Payer: 59 | Admitting: Internal Medicine

## 2018-07-08 ENCOUNTER — Telehealth: Payer: Self-pay | Admitting: Internal Medicine

## 2018-07-08 VITALS — BP 140/90 | HR 88 | Temp 98.3°F | Ht 65.5 in | Wt 236.0 lb

## 2018-07-08 DIAGNOSIS — R7302 Impaired glucose tolerance (oral): Secondary | ICD-10-CM

## 2018-07-08 DIAGNOSIS — J0100 Acute maxillary sinusitis, unspecified: Secondary | ICD-10-CM

## 2018-07-08 MED ORDER — LEVOFLOXACIN 500 MG PO TABS: 500.0000 mg | ORAL_TABLET | Freq: Every day | ORAL | 0 refills | Status: DC

## 2018-07-08 MED ORDER — HYDROCODONE-HOMATROPINE 5-1.5 MG/5ML PO SYRP: 5.0000 mL | ORAL_SOLUTION | Freq: Three times a day (TID) | ORAL | 0 refills | Status: DC | PRN

## 2018-07-08 MED ORDER — METHYLPREDNISOLONE ACETATE 80 MG/ML IJ SUSP
80.0000 mg | Freq: Once | INTRAMUSCULAR | Status: AC
  Administered 2018-07-08: 80 mg via INTRAMUSCULAR

## 2018-07-08 NOTE — Progress Notes (Signed)
   Subjective:    Patient ID: Barbara [name], female    DOB: 04/26/1956, 62 y.o.   MRN: [id number]  HPI 62 year old Female with history of impaired glucose tolerance has been sick for 1 week with respiratory infection.  Started after she was exposed to smoking in a game room.  Has discolored nasal drainage and some cough.  Has malaise and fatigue.  Had fever earlier in the week.    Review of Systems some cough but not a lot of sputum production     Objective:   Physical Exam  Constitutional: She appears well-developed and well-nourished. No distress.  HENT:  Right Ear: External ear normal.  Left Ear: External ear normal.  Mouth/Throat: Oropharynx is clear and moist.  Eyes: Conjunctivae are normal. Right eye exhibits no discharge. Left eye exhibits no discharge.  Neck: No JVD present. No thyromegaly present.  Cardiovascular: Normal rate and regular rhythm.  Pulmonary/Chest: Effort normal and breath sounds normal. No stridor. No respiratory distress. She has no wheezes. She has no rales.  Lymphadenopathy:    She has no cervical adenopathy.  Skin: Skin is warm and dry. She is not diaphoretic.  Vitals reviewed.         Assessment & Plan:  Acute maxillary sinusitis  Plan: Depo-Medrol 80 mg IM.  Says she has had some intermittent wheezing.  Hycodan 1 teaspoon p.o. every 8 hours as needed cough.  Levaquin 500 mg daily for 10 days.  Rest and drink plenty of fluids.

## 2018-07-08 NOTE — Patient Instructions (Signed)
Levaquin 500 milligrams daily for 10 days.  Depo-Medrol 80 mg IM.  Hycodan 1 teaspoon p.o. every 8 hours as needed cough.  Rest and drink plenty of fluids.

## 2018-07-08 NOTE — Telephone Encounter (Signed)
Patient called w/cough/congestion.  Offered to see today.  Patient declined.  Said she may have to go to Urgent Care because she couldn't get here before 1:00 p.m.  Patient then called back and said her hairdresser was going to finish rolling her hair and she is coming with rollers in her hair and she'll be here by 12:30.

## 2018-08-24 DIAGNOSIS — M19012 Primary osteoarthritis, left shoulder: Secondary | ICD-10-CM | POA: Diagnosis not present

## 2018-09-05 ENCOUNTER — Other Ambulatory Visit: Payer: Self-pay | Admitting: Internal Medicine

## 2018-10-16 ENCOUNTER — Other Ambulatory Visit: Payer: 59 | Admitting: Internal Medicine

## 2018-10-16 DIAGNOSIS — M19012 Primary osteoarthritis, left shoulder: Secondary | ICD-10-CM | POA: Diagnosis not present

## 2018-10-19 ENCOUNTER — Encounter: Payer: 59 | Admitting: Internal Medicine

## 2018-11-30 DIAGNOSIS — Z01419 Encounter for gynecological examination (general) (routine) without abnormal findings: Secondary | ICD-10-CM | POA: Diagnosis not present

## 2018-11-30 DIAGNOSIS — Z6839 Body mass index (BMI) 39.0-39.9, adult: Secondary | ICD-10-CM | POA: Diagnosis not present

## 2018-12-11 ENCOUNTER — Other Ambulatory Visit: Payer: 59 | Admitting: Internal Medicine

## 2018-12-14 ENCOUNTER — Encounter: Payer: 59 | Admitting: Internal Medicine

## 2019-02-16 ENCOUNTER — Other Ambulatory Visit: Payer: 59 | Admitting: Internal Medicine

## 2019-02-25 ENCOUNTER — Other Ambulatory Visit: Payer: 59 | Admitting: Internal Medicine

## 2019-02-26 ENCOUNTER — Encounter: Payer: 59 | Admitting: Internal Medicine

## 2019-03-19 ENCOUNTER — Other Ambulatory Visit: Payer: 59 | Admitting: Internal Medicine

## 2019-03-22 ENCOUNTER — Encounter: Payer: 59 | Admitting: Internal Medicine

## 2019-04-14 ENCOUNTER — Other Ambulatory Visit: Payer: Self-pay | Admitting: Internal Medicine

## 2019-06-17 ENCOUNTER — Other Ambulatory Visit: Payer: 59 | Admitting: Internal Medicine

## 2019-06-18 ENCOUNTER — Other Ambulatory Visit: Payer: 59 | Admitting: Internal Medicine

## 2019-06-18 ENCOUNTER — Other Ambulatory Visit: Payer: Self-pay

## 2019-06-18 DIAGNOSIS — E7800 Pure hypercholesterolemia, unspecified: Secondary | ICD-10-CM

## 2019-06-18 DIAGNOSIS — D649 Anemia, unspecified: Secondary | ICD-10-CM

## 2019-06-18 DIAGNOSIS — E559 Vitamin D deficiency, unspecified: Secondary | ICD-10-CM

## 2019-06-18 DIAGNOSIS — Z0000 Encounter for general adult medical examination without abnormal findings: Secondary | ICD-10-CM

## 2019-06-18 DIAGNOSIS — M7542 Impingement syndrome of left shoulder: Secondary | ICD-10-CM

## 2019-06-18 DIAGNOSIS — R7302 Impaired glucose tolerance (oral): Secondary | ICD-10-CM

## 2019-06-18 DIAGNOSIS — Z6841 Body Mass Index (BMI) 40.0 and over, adult: Secondary | ICD-10-CM

## 2019-06-18 DIAGNOSIS — I10 Essential (primary) hypertension: Secondary | ICD-10-CM

## 2019-06-21 ENCOUNTER — Encounter: Payer: Self-pay | Admitting: Internal Medicine

## 2019-06-21 ENCOUNTER — Other Ambulatory Visit: Payer: Self-pay

## 2019-06-21 ENCOUNTER — Ambulatory Visit (INDEPENDENT_AMBULATORY_CARE_PROVIDER_SITE_OTHER): Payer: 59 | Admitting: Internal Medicine

## 2019-06-21 VITALS — BP 110/80 | HR 67 | Temp 98.0°F | Ht 66.0 in | Wt 240.0 lb

## 2019-06-21 DIAGNOSIS — E559 Vitamin D deficiency, unspecified: Secondary | ICD-10-CM | POA: Diagnosis not present

## 2019-06-21 DIAGNOSIS — Z6838 Body mass index (BMI) 38.0-38.9, adult: Secondary | ICD-10-CM

## 2019-06-21 DIAGNOSIS — E8881 Metabolic syndrome: Secondary | ICD-10-CM

## 2019-06-21 DIAGNOSIS — E785 Hyperlipidemia, unspecified: Secondary | ICD-10-CM

## 2019-06-21 DIAGNOSIS — Z0000 Encounter for general adult medical examination without abnormal findings: Secondary | ICD-10-CM | POA: Diagnosis not present

## 2019-06-21 DIAGNOSIS — E1169 Type 2 diabetes mellitus with other specified complication: Secondary | ICD-10-CM | POA: Diagnosis not present

## 2019-06-21 DIAGNOSIS — R609 Edema, unspecified: Secondary | ICD-10-CM

## 2019-06-21 DIAGNOSIS — R718 Other abnormality of red blood cells: Secondary | ICD-10-CM

## 2019-06-21 DIAGNOSIS — D563 Thalassemia minor: Secondary | ICD-10-CM

## 2019-06-21 LAB — CBC WITH DIFFERENTIAL/PLATELET
Absolute Monocytes: 555 cells/uL (ref 200–950)
Basophils Absolute: 18 cells/uL (ref 0–200)
Basophils Relative: 0.3 %
Eosinophils Absolute: 268 cells/uL (ref 15–500)
Eosinophils Relative: 4.4 %
HCT: 37.9 % (ref 35.0–45.0)
Hemoglobin: 11.3 g/dL — ABNORMAL LOW (ref 11.7–15.5)
Lymphs Abs: 1647 cells/uL (ref 850–3900)
MCH: 22.1 pg — ABNORMAL LOW (ref 27.0–33.0)
MCHC: 29.8 g/dL — ABNORMAL LOW (ref 32.0–36.0)
MCV: 74 fL — ABNORMAL LOW (ref 80.0–100.0)
MPV: 10.4 fL (ref 7.5–12.5)
Monocytes Relative: 9.1 %
Neutro Abs: 3611 cells/uL (ref 1500–7800)
Neutrophils Relative %: 59.2 %
Platelets: 318 10*3/uL (ref 140–400)
RBC: 5.12 10*6/uL — ABNORMAL HIGH (ref 3.80–5.10)
RDW: 15.2 % — ABNORMAL HIGH (ref 11.0–15.0)
Total Lymphocyte: 27 %
WBC: 6.1 10*3/uL (ref 3.8–10.8)

## 2019-06-21 LAB — COMPLETE METABOLIC PANEL WITH GFR
AG Ratio: 1.6 (calc) (ref 1.0–2.5)
ALT: 10 U/L (ref 6–29)
AST: 16 U/L (ref 10–35)
Albumin: 4.1 g/dL (ref 3.6–5.1)
Alkaline phosphatase (APISO): 91 U/L (ref 37–153)
BUN: 15 mg/dL (ref 7–25)
CO2: 26 mmol/L (ref 20–32)
Calcium: 9.3 mg/dL (ref 8.6–10.4)
Chloride: 105 mmol/L (ref 98–110)
Creat: 0.8 mg/dL (ref 0.50–0.99)
GFR, Est African American: 92 mL/min/{1.73_m2} (ref >=60)
GFR, Est Non African American: 80 mL/min/{1.73_m2} (ref >=60)
Globulin: 2.6 g/dL (calc) (ref 1.9–3.7)
Glucose, Bld: 90 mg/dL (ref 65–99)
Potassium: 4.8 mmol/L (ref 3.5–5.3)
Sodium: 141 mmol/L (ref 135–146)
Total Bilirubin: 0.5 mg/dL (ref 0.2–1.2)
Total Protein: 6.7 g/dL (ref 6.1–8.1)

## 2019-06-21 LAB — POCT URINALYSIS DIPSTICK
Appearance: NEGATIVE
Bilirubin, UA: NEGATIVE
Blood, UA: NEGATIVE
Glucose, UA: NEGATIVE
Ketones, UA: NEGATIVE
Leukocytes, UA: NEGATIVE
Nitrite, UA: NEGATIVE
Odor: NEGATIVE
Protein, UA: NEGATIVE
Spec Grav, UA: 1.01 (ref 1.010–1.025)
Urobilinogen, UA: 0.2 E.U./dL
pH, UA: 6.5 (ref 5.0–8.0)

## 2019-06-21 LAB — MICROALBUMIN / CREATININE URINE RATIO
Creatinine, Urine: 118 mg/dL (ref 20–275)
Microalb Creat Ratio: 6 mcg/mg creat (ref <30)
Microalb, Ur: 0.7 mg/dL

## 2019-06-21 LAB — TEST AUTHORIZATION

## 2019-06-21 LAB — TSH: TSH: 2.14 mIU/L (ref 0.40–4.50)

## 2019-06-21 LAB — HEMOGLOBIN A1C
Hgb A1c MFr Bld: 5.9 % of total Hgb — ABNORMAL HIGH (ref <5.7)
Mean Plasma Glucose: 123 (calc)
eAG (mmol/L): 6.8 (calc)

## 2019-06-21 LAB — LIPID PANEL
Cholesterol: 254 mg/dL — ABNORMAL HIGH (ref <200)
HDL: 47 mg/dL — ABNORMAL LOW (ref >=50)
LDL Cholesterol (Calc): 181 mg/dL (calc) — ABNORMAL HIGH
Non-HDL Cholesterol (Calc): 207 mg/dL (calc) — ABNORMAL HIGH (ref <130)
Total CHOL/HDL Ratio: 5.4 (calc) — ABNORMAL HIGH (ref <5.0)
Triglycerides: 126 mg/dL (ref <150)

## 2019-06-21 LAB — IRON, TOTAL/TOTAL IRON BINDING CAP
%SAT: 26 % (calc) (ref 16–45)
Iron: 86 ug/dL (ref 45–160)
TIBC: 328 mcg/dL (calc) (ref 250–450)

## 2019-06-21 LAB — VITAMIN D 25 HYDROXY (VIT D DEFICIENCY, FRACTURES): Vit D, 25-Hydroxy: 16 ng/mL — ABNORMAL LOW (ref 30–100)

## 2019-06-21 MED ORDER — ROSUVASTATIN CALCIUM 5 MG PO TABS: 5.0000 mg | ORAL_TABLET | Freq: Every day | ORAL | 3 refills | Status: DC

## 2019-06-21 MED ORDER — ERGOCALCIFEROL 1.25 MG (50000 UT) PO CAPS: 50000.0000 [IU] | ORAL_CAPSULE | ORAL | 2 refills | Status: DC

## 2019-06-21 NOTE — Patient Instructions (Signed)
Declines flu vaccine. Continue same meds. Add Crestor 5 mg 3 times a week and check lipid and liver panels late January without OV
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
## 2019-09-02 ENCOUNTER — Other Ambulatory Visit: Payer: 59 | Admitting: Internal Medicine

## 2019-09-16 ENCOUNTER — Other Ambulatory Visit: Payer: 59 | Admitting: Internal Medicine

## 2019-10-21 ENCOUNTER — Ambulatory Visit: Payer: 59 | Attending: Internal Medicine

## 2019-11-05 ENCOUNTER — Other Ambulatory Visit: Payer: Self-pay | Admitting: Orthopedic Surgery

## 2019-11-05 ENCOUNTER — Telehealth: Payer: Self-pay | Admitting: Internal Medicine

## 2019-11-05 DIAGNOSIS — M25512 Pain in left shoulder: Secondary | ICD-10-CM

## 2019-11-05 NOTE — Telephone Encounter (Signed)
Received fax from Dr  Audrea Muscat office for surgery clearance for patient to have Left Total shoulder replacement. Called patient and let her know we would need office visit for surgery clearance, she was with customer and said she would call back to schedule. She is looking at having surgery in July.

## 2019-11-18 ENCOUNTER — Other Ambulatory Visit: Payer: 59

## 2020-04-07 DIAGNOSIS — M1990 Unspecified osteoarthritis, unspecified site: Secondary | ICD-10-CM | POA: Insufficient documentation

## 2020-04-07 DIAGNOSIS — D649 Anemia, unspecified: Secondary | ICD-10-CM | POA: Insufficient documentation

## 2020-04-23 ENCOUNTER — Other Ambulatory Visit: Payer: Self-pay | Admitting: Internal Medicine

## 2020-04-24 ENCOUNTER — Telehealth: Payer: Self-pay | Admitting: Internal Medicine

## 2020-04-24 NOTE — Telephone Encounter (Signed)
I refilled this yesterday electronically. There is no reason to switch. I have left her a message there would be a charge to switch. She will call us if she wants to switch.

## 2020-04-24 NOTE — Telephone Encounter (Signed)
Barbara [name] [phone]  Nicole Cella called to say that she went to pick up her water pill and they had already put it back in stock and it was last day that it could be picked up and they would not fill it, so they told her she would need new prescription. So she has now decided to use different pharmacy.  furosemide (LASIX) 20 MG tablet / cholecalciferol (VITAMIN D) 1000 units tablet   NEW Pharmacy  [facility] at [id number] in [place]

## 2020-05-02 ENCOUNTER — Telehealth: Payer: Self-pay

## 2020-05-02 ENCOUNTER — Encounter: Payer: Self-pay | Admitting: Internal Medicine

## 2020-05-02 ENCOUNTER — Other Ambulatory Visit: Payer: Self-pay

## 2020-05-02 ENCOUNTER — Ambulatory Visit: Payer: 59 | Admitting: Internal Medicine

## 2020-05-02 VITALS — HR 88 | Temp 100.2°F | Ht 66.0 in

## 2020-05-02 DIAGNOSIS — Z6838 Body mass index (BMI) 38.0-38.9, adult: Secondary | ICD-10-CM

## 2020-05-02 DIAGNOSIS — J22 Unspecified acute lower respiratory infection: Secondary | ICD-10-CM

## 2020-05-02 DIAGNOSIS — E1169 Type 2 diabetes mellitus with other specified complication: Secondary | ICD-10-CM

## 2020-05-02 DIAGNOSIS — Z87898 Personal history of other specified conditions: Secondary | ICD-10-CM

## 2020-05-02 DIAGNOSIS — R509 Fever, unspecified: Secondary | ICD-10-CM

## 2020-05-02 DIAGNOSIS — J301 Allergic rhinitis due to pollen: Secondary | ICD-10-CM

## 2020-05-02 DIAGNOSIS — E785 Hyperlipidemia, unspecified: Secondary | ICD-10-CM

## 2020-05-02 DIAGNOSIS — R059 Cough, unspecified: Secondary | ICD-10-CM

## 2020-05-02 LAB — POCT RAPID STREP A (OFFICE): Rapid Strep A Screen: NEGATIVE

## 2020-05-02 MED ORDER — METHYLPREDNISOLONE ACETATE 80 MG/ML IJ SUSP
80.0000 mg | Freq: Once | INTRAMUSCULAR | Status: AC
  Administered 2020-05-02: 80 mg via INTRAMUSCULAR

## 2020-05-02 MED ORDER — LEVOFLOXACIN 500 MG PO TABS: 500.0000 mg | ORAL_TABLET | Freq: Every day | ORAL | 0 refills | Status: DC

## 2020-05-02 MED ORDER — FLUTICASONE PROPIONATE 50 MCG/ACT NA SUSP: 2.0000 | Freq: Every day | NASAL | 6 refills | Status: DC

## 2020-05-02 NOTE — Telephone Encounter (Signed)
Patient called with sinusitis symptoms, symptoms developed on Sunday she is coughing up green, has a sore throat. She had both COVID shots in April. Temperature is 98. She would like to be seen.

## 2020-05-02 NOTE — Telephone Encounter (Signed)
Scheduled

## 2020-05-02 NOTE — Telephone Encounter (Signed)
Lobby visit 4 pm- may be raining

## 2020-05-03 LAB — SARS-COV-2 RNA,(COVID-19) QUALITATIVE NAAT: SARS CoV2 RNA: NOT DETECTED

## 2020-05-04 ENCOUNTER — Encounter: Payer: Self-pay | Admitting: Internal Medicine

## 2020-05-04 NOTE — Telephone Encounter (Signed)
Patient called back and wanted to know if she could get a note to be out of work threw Friday, since she is still having symptoms and go back to work on Monday.

## 2020-05-04 NOTE — Telephone Encounter (Signed)
This is fine 

## 2020-05-05 LAB — RESPIRATORY VIRUS PANEL
Adenovirus B: NOT DETECTED
HUMAN PARAINFLU VIRUS 1: NOT DETECTED
HUMAN PARAINFLU VIRUS 2: NOT DETECTED
HUMAN PARAINFLU VIRUS 3: NOT DETECTED
INFLUENZA A SUBTYPE H1: NOT DETECTED
INFLUENZA A SUBTYPE H3: NOT DETECTED
Influenza A: NOT DETECTED
Influenza B: NOT DETECTED
Metapneumovirus: NOT DETECTED
Respiratory Syncytial Virus A: NOT DETECTED
Respiratory Syncytial Virus B: NOT DETECTED
Rhinovirus: DETECTED — AB

## 2020-05-05 LAB — TIQ-NTM

## 2020-05-06 DIAGNOSIS — E1169 Type 2 diabetes mellitus with other specified complication: Secondary | ICD-10-CM | POA: Insufficient documentation

## 2020-05-06 NOTE — Patient Instructions (Addendum)
COVID-19 and respiratory virus panels obtained.  Note to be out of work until these tests are back.  Levaquin 500 mg by mouth daily for 10 days.  Rest and drink plenty of fluids.  Flonase nasal spray refilled at patient request.  Depo-Medrol 80 mg IM given today in office

## 2020-05-06 NOTE — Progress Notes (Signed)
   Subjective:    Patient ID: Barbara [name], female    DOB: [birth year], 64 y.o.   MRN: [id number]  HPI 64 year old Female seen today with cough and sore throat.  Has malaise and fatigue.  She has a history of impaired glucose tolerance, pure hypercholesterolemia, obesity and metabolic syndrome.  She takes Lasix for dependent edema.  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  She also worked at [employer] for over 30 years before [employer] closed.  She had 2 COVID-19 immunizations in March and April of this year.  No known COVID-19 exposure but she has exposure to the public with her job.   Risk factors for COVID-19 include impaired glucose tolerance and obesity   Review of Systems see above-has malaise and fatigue.  No dysgeusia.  No shaking chills.  No nausea and vomiting.     Objective:   Physical Exam Temperature 100.2 degrees pulse oximetry 98% pulse 88 regular.  COVID-19 test and respiratory virus panel obtained.  Her chest is clear to auscultation.       Assessment & Plan:  Acute lower respiratory infection-rule out COVID-19 infection.  Respiratory virus panel obtained in addition to COVID-19 test.  Plan: Levaquin 500 mg by mouth daily for 10 days.  Depo-Medrol 80 mg IM given in office today to help with congestion.  Patient is to rest and drink plenty of fluids.  Note to be out of work until test results are back.

## 2020-05-15 ENCOUNTER — Ambulatory Visit
Admission: RE | Admit: 2020-05-15 | Discharge: 2020-05-15 | Disposition: A | Discharge status: Home / Self Care | Payer: 59 | Source: Ambulatory Visit | Attending: Internal Medicine | Admitting: Internal Medicine

## 2020-05-15 ENCOUNTER — Encounter: Payer: Self-pay | Admitting: Internal Medicine

## 2020-05-15 ENCOUNTER — Ambulatory Visit (INDEPENDENT_AMBULATORY_CARE_PROVIDER_SITE_OTHER): Payer: 59 | Admitting: Internal Medicine

## 2020-05-15 ENCOUNTER — Other Ambulatory Visit: Payer: Self-pay

## 2020-05-15 VITALS — BP 110/80 | HR 82 | Temp 97.8°F | Ht 66.0 in | Wt 248.0 lb

## 2020-05-15 DIAGNOSIS — R059 Cough, unspecified: Secondary | ICD-10-CM

## 2020-05-15 DIAGNOSIS — R7302 Impaired glucose tolerance (oral): Secondary | ICD-10-CM | POA: Diagnosis not present

## 2020-05-15 DIAGNOSIS — J206 Acute bronchitis due to rhinovirus: Secondary | ICD-10-CM

## 2020-05-15 DIAGNOSIS — J22 Unspecified acute lower respiratory infection: Secondary | ICD-10-CM

## 2020-05-15 MED ORDER — PREDNISONE 10 MG PO TABS: ORAL_TABLET | ORAL | 0 refills | Status: DC

## 2020-05-15 MED ORDER — LOSARTAN POTASSIUM 25 MG PO TABS
25.0000 mg | ORAL_TABLET | Freq: Every day | ORAL | 1 refills | Status: DC
Start: 2020-05-15 — End: 2020-06-13

## 2020-05-15 MED ORDER — FLUCONAZOLE 150 MG PO TABS: 150.0000 mg | ORAL_TABLET | Freq: Once | ORAL | 1 refills | Status: AC

## 2020-05-15 MED ORDER — LEVOFLOXACIN 500 MG PO TABS: 500.0000 mg | ORAL_TABLET | Freq: Every day | ORAL | 0 refills | Status: DC

## 2020-05-15 NOTE — Telephone Encounter (Signed)
Pt aware, xray placed. Scheduled appt.

## 2020-05-15 NOTE — Telephone Encounter (Signed)
Patient called back she said she has one more dose of her antibiotic left and she is still coughing she said the cough will not go away. She wants to know if you can call her in something for her cough?

## 2020-05-15 NOTE — Telephone Encounter (Signed)
She should be better by now. Please have her get CXR and come late this afternoon

## 2020-06-08 ENCOUNTER — Encounter: Payer: Self-pay | Admitting: Internal Medicine

## 2020-06-08 NOTE — Patient Instructions (Signed)
Levaquin 500 mg daily for 10 days.  Take prednisone in tapering course starting with 60 mg day 1 and decreasing by 10 mg daily.  Rest and drink plenty of fluids.  Chest x-ray done shows no evidence of pneumonia.  Call if not improving.

## 2020-06-08 NOTE — Progress Notes (Signed)
   Subjective:    Patient ID: Barbara [name], female    DOB: 1956/05/05, 64 y.o.   MRN: [id number]  HPI Barbara [name] was seen September 21 with an acute respiratory infection.  At that time she was complaining of cough and sore throat as well as malaise and fatigue.  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  She had had 2 COVID-19 immunizations in March and April of this year.  She was febrile with temperature 100.2 degrees.  Her pulse ox is 98%.  COVID-19 test obtained and proved to be negative. Respiratory virus panel was positive for rhinovirus.  Patient was treated with Levaquin 500 mg daily for 10 days.  Was given Depo-Medrol 80 mg IM to help with congestion.  Was to be out of work until COVID-19 results were back.  Patient says she still is coughing and has persistent respiratory congestion.  No fever or shaking chills.  Chest x-ray was performed today showing no active cardiopulmonary disease.  Heart size is normal and both lungs are clear.   Review of Systems see above-has sputum production that is slightly discolored     Objective:   Physical Exam Blood pressure 110/80, pulse 82 and regular, temperature 97.8 degrees pulse oximetry 97% weight 248 pounds BMI 40.03, respiratory rate is normal  Skin: Warm and dry.  No cervical adenopathy.  TMs are clear.  Pharynx is clear.  Neck is supple.  Chest is clear to auscultation without rales or wheezing.     Assessment & Plan:  Protracted respiratory infection-rhinovirus detected on respiratory virus panel  History of glucose intolerance  Plan: Levaquin 500 mg daily for 10 days.  Take prednisone in tapering course starting with 6 tablets day 1 and decreasing by 10 mg daily over 6 days, i.e. 6-5-4-3-2-1 taper.  Rest and drink plenty of fluids.  Reassured patient she does not have pneumonia and chest x-ray is negative.  Time spent seeing patient, reviewing medical record, medical decision making, electronic  prescribing and reviewing chest x-ray results is 20 minutes

## 2020-06-13 ENCOUNTER — Other Ambulatory Visit: Payer: Self-pay

## 2020-06-13 MED ORDER — LOSARTAN POTASSIUM 25 MG PO TABS
25.0000 mg | ORAL_TABLET | Freq: Every day | ORAL | 1 refills | Status: DC
Start: 2020-06-13 — End: 2021-06-25

## 2020-06-22 ENCOUNTER — Other Ambulatory Visit: Payer: Self-pay

## 2020-06-22 ENCOUNTER — Other Ambulatory Visit: Payer: 59 | Admitting: Internal Medicine

## 2020-06-22 DIAGNOSIS — R7302 Impaired glucose tolerance (oral): Secondary | ICD-10-CM

## 2020-06-22 DIAGNOSIS — E8881 Metabolic syndrome: Secondary | ICD-10-CM

## 2020-06-22 DIAGNOSIS — E559 Vitamin D deficiency, unspecified: Secondary | ICD-10-CM

## 2020-06-22 DIAGNOSIS — E1169 Type 2 diabetes mellitus with other specified complication: Secondary | ICD-10-CM

## 2020-06-22 DIAGNOSIS — E785 Hyperlipidemia, unspecified: Secondary | ICD-10-CM

## 2020-06-22 DIAGNOSIS — J301 Allergic rhinitis due to pollen: Secondary | ICD-10-CM

## 2020-06-22 DIAGNOSIS — Z6838 Body mass index (BMI) 38.0-38.9, adult: Secondary | ICD-10-CM

## 2020-06-22 DIAGNOSIS — Z0000 Encounter for general adult medical examination without abnormal findings: Secondary | ICD-10-CM

## 2020-06-22 DIAGNOSIS — R718 Other abnormality of red blood cells: Secondary | ICD-10-CM

## 2020-06-23 ENCOUNTER — Encounter: Payer: Self-pay | Admitting: Internal Medicine

## 2020-06-23 ENCOUNTER — Other Ambulatory Visit: Payer: Self-pay

## 2020-06-23 ENCOUNTER — Ambulatory Visit (INDEPENDENT_AMBULATORY_CARE_PROVIDER_SITE_OTHER): Payer: 59 | Admitting: Internal Medicine

## 2020-06-23 VITALS — BP 130/80 | HR 66 | Ht 66.0 in | Wt 248.0 lb

## 2020-06-23 DIAGNOSIS — E8881 Metabolic syndrome: Secondary | ICD-10-CM

## 2020-06-23 DIAGNOSIS — R718 Other abnormality of red blood cells: Secondary | ICD-10-CM

## 2020-06-23 DIAGNOSIS — E1169 Type 2 diabetes mellitus with other specified complication: Secondary | ICD-10-CM

## 2020-06-23 DIAGNOSIS — D509 Iron deficiency anemia, unspecified: Secondary | ICD-10-CM

## 2020-06-23 DIAGNOSIS — Z23 Encounter for immunization: Secondary | ICD-10-CM | POA: Diagnosis not present

## 2020-06-23 DIAGNOSIS — R7302 Impaired glucose tolerance (oral): Secondary | ICD-10-CM

## 2020-06-23 DIAGNOSIS — Z87898 Personal history of other specified conditions: Secondary | ICD-10-CM

## 2020-06-23 DIAGNOSIS — Z0000 Encounter for general adult medical examination without abnormal findings: Secondary | ICD-10-CM

## 2020-06-23 DIAGNOSIS — D649 Anemia, unspecified: Secondary | ICD-10-CM

## 2020-06-23 DIAGNOSIS — D563 Thalassemia minor: Secondary | ICD-10-CM

## 2020-06-23 DIAGNOSIS — E785 Hyperlipidemia, unspecified: Secondary | ICD-10-CM

## 2020-06-23 DIAGNOSIS — R609 Edema, unspecified: Secondary | ICD-10-CM

## 2020-06-23 LAB — POCT URINALYSIS DIPSTICK
Appearance: NEGATIVE
Bilirubin, UA: NEGATIVE
Blood, UA: NEGATIVE
Glucose, UA: NEGATIVE
Ketones, UA: NEGATIVE
Leukocytes, UA: NEGATIVE
Nitrite, UA: NEGATIVE
Odor: NEGATIVE
Protein, UA: NEGATIVE
Spec Grav, UA: 1.015 (ref 1.010–1.025)
Urobilinogen, UA: 0.2 E.U./dL
pH, UA: 6.5 (ref 5.0–8.0)

## 2020-06-23 MED ORDER — ERGOCALCIFEROL 1.25 MG (50000 UT) PO CAPS: 50000.0000 [IU] | ORAL_CAPSULE | ORAL | 3 refills | Status: DC
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
## 2020-06-24 LAB — FOLATE: Folate: 17.5 ng/mL

## 2020-06-24 LAB — CBC WITH DIFFERENTIAL/PLATELET
Absolute Monocytes: 601 cells/uL (ref 200–950)
Basophils Absolute: 20 cells/uL (ref 0–200)
Basophils Relative: 0.3 %
Eosinophils Absolute: 152 cells/uL (ref 15–500)
Eosinophils Relative: 2.3 %
HCT: 35.4 % (ref 35.0–45.0)
Hemoglobin: 10.8 g/dL — ABNORMAL LOW (ref 11.7–15.5)
Lymphs Abs: 1637 cells/uL (ref 850–3900)
MCH: 22.7 pg — ABNORMAL LOW (ref 27.0–33.0)
MCHC: 30.5 g/dL — ABNORMAL LOW (ref 32.0–36.0)
MCV: 74.5 fL — ABNORMAL LOW (ref 80.0–100.0)
MPV: 10 fL (ref 7.5–12.5)
Monocytes Relative: 9.1 %
Neutro Abs: 4191 cells/uL (ref 1500–7800)
Neutrophils Relative %: 63.5 %
Platelets: 333 10*3/uL (ref 140–400)
RBC: 4.75 10*6/uL (ref 3.80–5.10)
RDW: 15.1 % — ABNORMAL HIGH (ref 11.0–15.0)
Total Lymphocyte: 24.8 %
WBC: 6.6 10*3/uL (ref 3.8–10.8)

## 2020-06-24 LAB — COMPLETE METABOLIC PANEL WITH GFR
AG Ratio: 1.6 (calc) (ref 1.0–2.5)
ALT: 9 U/L (ref 6–29)
AST: 12 U/L (ref 10–35)
Albumin: 4 g/dL (ref 3.6–5.1)
Alkaline phosphatase (APISO): 84 U/L (ref 37–153)
BUN: 15 mg/dL (ref 7–25)
CO2: 26 mmol/L (ref 20–32)
Calcium: 9 mg/dL (ref 8.6–10.4)
Chloride: 107 mmol/L (ref 98–110)
Creat: 0.76 mg/dL (ref 0.50–0.99)
GFR, Est African American: 97 mL/min/{1.73_m2} (ref >=60)
GFR, Est Non African American: 84 mL/min/{1.73_m2} (ref >=60)
Globulin: 2.5 g/dL (calc) (ref 1.9–3.7)
Glucose, Bld: 85 mg/dL (ref 65–99)
Potassium: 4.4 mmol/L (ref 3.5–5.3)
Sodium: 142 mmol/L (ref 135–146)
Total Bilirubin: 0.4 mg/dL (ref 0.2–1.2)
Total Protein: 6.5 g/dL (ref 6.1–8.1)

## 2020-06-24 LAB — FERRITIN: Ferritin: 31 ng/mL (ref 16–288)

## 2020-06-24 LAB — LIPID PANEL
Cholesterol: 247 mg/dL — ABNORMAL HIGH (ref <200)
HDL: 53 mg/dL (ref >=50)
LDL Cholesterol (Calc): 176 mg/dL (calc) — ABNORMAL HIGH
Non-HDL Cholesterol (Calc): 194 mg/dL (calc) — ABNORMAL HIGH (ref <130)
Total CHOL/HDL Ratio: 4.7 (calc) (ref <5.0)
Triglycerides: 77 mg/dL (ref <150)

## 2020-06-24 LAB — TEST AUTHORIZATION

## 2020-06-24 LAB — HEMOGLOBIN A1C
Hgb A1c MFr Bld: 5.9 % of total Hgb — ABNORMAL HIGH (ref <5.7)
Mean Plasma Glucose: 123 (calc)
eAG (mmol/L): 6.8 (calc)

## 2020-06-24 LAB — IRON, TOTAL/TOTAL IRON BINDING CAP
%SAT: 17 % (calc) (ref 16–45)
Iron: 56 ug/dL (ref 45–160)
TIBC: 327 mcg/dL (calc) (ref 250–450)

## 2020-06-24 LAB — TSH: TSH: 2.03 mIU/L (ref 0.40–4.50)

## 2020-06-24 LAB — MICROALBUMIN / CREATININE URINE RATIO
Creatinine, Urine: 81 mg/dL (ref 20–275)
Microalb Creat Ratio: 7 mcg/mg creat (ref <30)
Microalb, Ur: 0.6 mg/dL

## 2020-06-24 LAB — VITAMIN D 25 HYDROXY (VIT D DEFICIENCY, FRACTURES): Vit D, 25-Hydroxy: 19 ng/mL — ABNORMAL LOW (ref 30–100)

## 2020-06-24 LAB — VITAMIN B12: Vitamin B-12: 427 pg/mL (ref 200–1100)

## 2020-07-17 ENCOUNTER — Telehealth: Payer: Self-pay | Admitting: Internal Medicine

## 2020-07-17 ENCOUNTER — Ambulatory Visit: Payer: 59 | Admitting: Internal Medicine

## 2020-07-17 NOTE — Telephone Encounter (Signed)
Pt called and said over the weekend she fell in her garage and she fell on her left side, she said she is fine now but said she now occasionally will start hearing ringing in her left ear and it comes and goes. She wanted to know what kind of doctor you would recommend she see for that or if she should be fine. She said she didn't fall hard enough to really hurt herself but it just shook her up. Please advise

## 2020-07-17 NOTE — Telephone Encounter (Signed)
Maybe let's just check her out this afternoon if she has time.

## 2020-07-17 NOTE — Telephone Encounter (Signed)
pt said she wont be able to make it to this appt, she said she is going to go se an ENT sometime this week

## 2020-07-17 NOTE — Telephone Encounter (Signed)
Scheduled

## 2020-07-20 DIAGNOSIS — H9312 Tinnitus, left ear: Secondary | ICD-10-CM | POA: Insufficient documentation

## 2020-08-13 NOTE — Patient Instructions (Addendum)
Make sure you have annual mammogram.  You need follow-up colonoscopy in 2022.  Continue diet and exercise regimen.  Tetanus immunization update given.

## 2020-10-10 LAB — HM DIABETES EYE EXAM

## 2021-04-24 ENCOUNTER — Other Ambulatory Visit: Payer: Self-pay | Admitting: Internal Medicine

## 2021-05-25 LAB — HM PAP SMEAR

## 2021-06-23 ENCOUNTER — Other Ambulatory Visit: Payer: Self-pay | Admitting: Internal Medicine

## 2021-07-19 ENCOUNTER — Other Ambulatory Visit: Payer: 59 | Admitting: Internal Medicine

## 2021-07-19 ENCOUNTER — Other Ambulatory Visit: Payer: Self-pay

## 2021-07-19 DIAGNOSIS — E1169 Type 2 diabetes mellitus with other specified complication: Secondary | ICD-10-CM

## 2021-07-19 DIAGNOSIS — R7302 Impaired glucose tolerance (oral): Secondary | ICD-10-CM

## 2021-07-19 DIAGNOSIS — I10 Essential (primary) hypertension: Secondary | ICD-10-CM

## 2021-07-19 DIAGNOSIS — Z1329 Encounter for screening for other suspected endocrine disorder: Secondary | ICD-10-CM

## 2021-07-19 DIAGNOSIS — E785 Hyperlipidemia, unspecified: Secondary | ICD-10-CM

## 2021-07-20 ENCOUNTER — Encounter: Payer: 59 | Admitting: Internal Medicine

## 2021-07-20 LAB — CBC WITH DIFFERENTIAL/PLATELET
Absolute Monocytes: 546 cells/uL (ref 200–950)
Basophils Absolute: 19 cells/uL (ref 0–200)
Basophils Relative: 0.3 %
Eosinophils Absolute: 211 cells/uL (ref 15–500)
Eosinophils Relative: 3.4 %
HCT: 37.2 % (ref 35.0–45.0)
Hemoglobin: 11.2 g/dL — ABNORMAL LOW (ref 11.7–15.5)
Lymphs Abs: 1680 cells/uL (ref 850–3900)
MCH: 22.1 pg — ABNORMAL LOW (ref 27.0–33.0)
MCHC: 30.1 g/dL — ABNORMAL LOW (ref 32.0–36.0)
MCV: 73.4 fL — ABNORMAL LOW (ref 80.0–100.0)
MPV: 10.7 fL (ref 7.5–12.5)
Monocytes Relative: 8.8 %
Neutro Abs: 3745 cells/uL (ref 1500–7800)
Neutrophils Relative %: 60.4 %
Platelets: 345 10*3/uL (ref 140–400)
RBC: 5.07 10*6/uL (ref 3.80–5.10)
RDW: 15.7 % — ABNORMAL HIGH (ref 11.0–15.0)
Total Lymphocyte: 27.1 %
WBC: 6.2 10*3/uL (ref 3.8–10.8)

## 2021-07-20 LAB — HEMOGLOBIN A1C
Hgb A1c MFr Bld: 6 % of total Hgb — ABNORMAL HIGH (ref <5.7)
Mean Plasma Glucose: 126 mg/dL
eAG (mmol/L): 7 mmol/L

## 2021-07-20 LAB — COMPLETE METABOLIC PANEL WITH GFR
AG Ratio: 1.5 (calc) (ref 1.0–2.5)
ALT: 9 U/L (ref 6–29)
AST: 14 U/L (ref 10–35)
Albumin: 4.1 g/dL (ref 3.6–5.1)
Alkaline phosphatase (APISO): 93 U/L (ref 37–153)
BUN: 15 mg/dL (ref 7–25)
CO2: 28 mmol/L (ref 20–32)
Calcium: 9 mg/dL (ref 8.6–10.4)
Chloride: 106 mmol/L (ref 98–110)
Creat: 0.68 mg/dL (ref 0.50–1.05)
Globulin: 2.7 g/dL (calc) (ref 1.9–3.7)
Glucose, Bld: 87 mg/dL (ref 65–99)
Potassium: 4.5 mmol/L (ref 3.5–5.3)
Sodium: 142 mmol/L (ref 135–146)
Total Bilirubin: 0.4 mg/dL (ref 0.2–1.2)
Total Protein: 6.8 g/dL (ref 6.1–8.1)
eGFR: 98 mL/min/{1.73_m2} (ref >=60)

## 2021-07-20 LAB — LIPID PANEL
Cholesterol: 216 mg/dL — ABNORMAL HIGH (ref <200)
HDL: 46 mg/dL — ABNORMAL LOW (ref >=50)
LDL Cholesterol (Calc): 143 mg/dL (calc) — ABNORMAL HIGH
Non-HDL Cholesterol (Calc): 170 mg/dL (calc) — ABNORMAL HIGH (ref <130)
Total CHOL/HDL Ratio: 4.7 (calc) (ref <5.0)
Triglycerides: 145 mg/dL (ref <150)

## 2021-07-20 LAB — TSH: TSH: 2.1 mIU/L (ref 0.40–4.50)

## 2021-07-24 ENCOUNTER — Telehealth: Payer: Self-pay | Admitting: Internal Medicine

## 2021-07-24 NOTE — Telephone Encounter (Signed)
[name] [phone]  Nicole Cella called to say she had gone to the dentist today to have her bridge repaired and they could not fix it because her blood pressure was elevated and they said she would have to follow up with her PCP before she cane have it repaired. Blood pressure was 181/100 and 187/101, she had not taking her blood pressure medicine. After talking with her she first wanted you to just call her, I let her know you were out of office until Thursday and she want on to say she had not been taking her medication correctly. I let her know you would want a log of 2 weeks with at least 2 readings a day and to take her medication as prescribe and to take reading 2 hours after taking medication. She has an appointment on 08/01/2021 I ask to bring log to appointment, she verbalized understanding.

## 2021-08-01 ENCOUNTER — Encounter: Payer: Self-pay | Admitting: Internal Medicine

## 2021-08-01 ENCOUNTER — Other Ambulatory Visit: Payer: Self-pay

## 2021-08-01 ENCOUNTER — Ambulatory Visit (INDEPENDENT_AMBULATORY_CARE_PROVIDER_SITE_OTHER): Payer: 59 | Admitting: Internal Medicine

## 2021-08-01 VITALS — BP 128/90 | HR 78 | Temp 97.8°F | Ht 65.0 in | Wt 250.0 lb

## 2021-08-01 DIAGNOSIS — E559 Vitamin D deficiency, unspecified: Secondary | ICD-10-CM

## 2021-08-01 DIAGNOSIS — Z0000 Encounter for general adult medical examination without abnormal findings: Secondary | ICD-10-CM

## 2021-08-01 DIAGNOSIS — D563 Thalassemia minor: Secondary | ICD-10-CM

## 2021-08-01 DIAGNOSIS — E1169 Type 2 diabetes mellitus with other specified complication: Secondary | ICD-10-CM

## 2021-08-01 DIAGNOSIS — R609 Edema, unspecified: Secondary | ICD-10-CM

## 2021-08-01 DIAGNOSIS — R7302 Impaired glucose tolerance (oral): Secondary | ICD-10-CM

## 2021-08-01 DIAGNOSIS — Z6841 Body Mass Index (BMI) 40.0 and over, adult: Secondary | ICD-10-CM | POA: Diagnosis not present

## 2021-08-01 DIAGNOSIS — E8881 Metabolic syndrome: Secondary | ICD-10-CM

## 2021-08-01 DIAGNOSIS — Z23 Encounter for immunization: Secondary | ICD-10-CM

## 2021-08-01 DIAGNOSIS — E785 Hyperlipidemia, unspecified: Secondary | ICD-10-CM

## 2021-08-01 LAB — POCT URINALYSIS DIPSTICK
Bilirubin, UA: NEGATIVE
Glucose, UA: NEGATIVE
Ketones, UA: NEGATIVE
Leukocytes, UA: NEGATIVE
Nitrite, UA: NEGATIVE
Protein, UA: NEGATIVE
Spec Grav, UA: 1.025 (ref 1.010–1.025)
Urobilinogen, UA: 0.2 E.U./dL
pH, UA: 5.5 (ref 5.0–8.0)

## 2021-08-01 MED ORDER — ALPRAZOLAM 0.5 MG PO TABS: 0.5000 mg | ORAL_TABLET | Freq: Every evening | ORAL | 0 refills | Status: DC | PRN

## 2021-08-01 MED ORDER — ROSUVASTATIN CALCIUM 10 MG PO TABS: 10.0000 mg | ORAL_TABLET | Freq: Every day | ORAL | 3 refills | Status: DC
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
## 2021-08-02 LAB — URINE CULTURE
MICRO NUMBER:: 12785653
SPECIMEN QUALITY:: ADEQUATE

## 2021-08-14 ENCOUNTER — Telehealth: Payer: Self-pay

## 2021-08-14 NOTE — Telephone Encounter (Signed)
Patient declined coming to office, I offered her an appointment at 10:45, 3:45 today, and also Thursday. She said she would go to Urgent Care in [place] because she can just walk in there. She states that what she is feeling is a reaction to [employer], and she was told to just call if she had reaction. She does not have COVID. I did not mention COVID when I called to schedule appointment, after she brought it up, I let her know with these symptoms it was our protocol to have patient do a at home COVID test to make sure patient did not have COVID before letting them come into office,

## 2021-08-14 NOTE — Telephone Encounter (Signed)
Patient states that every time she gets a flu shot she starts to feel likes he has the flu. Starting Friday she started with chest congestion and a little chest congestion. She has no fever and no headache, no drainage but she does have a sore throat. She did not take a covid test because this is what happens every time she gets a flu shot and she states it was discussed in office visit. She would like a medications sent in. I told her she will probably have to take a covid test but she wanted to have Dr Lenord Fellers decide.

## 2021-09-08 NOTE — Patient Instructions (Addendum)
It was a pleasure to see you today.  Please start statin medication daily and follow-up in 6 months.  Continue current medications.  Please work on diet and exercise and weight loss.  We have previously recommended [facility] for weight loss.  This is a good option.  Flu vaccine given.

## 2021-09-25 ENCOUNTER — Ambulatory Visit (INDEPENDENT_AMBULATORY_CARE_PROVIDER_SITE_OTHER): Payer: 59 | Admitting: Internal Medicine

## 2021-09-25 ENCOUNTER — Encounter: Payer: Self-pay | Admitting: Internal Medicine

## 2021-09-25 ENCOUNTER — Other Ambulatory Visit: Payer: Self-pay

## 2021-09-25 ENCOUNTER — Telehealth: Payer: Self-pay

## 2021-09-25 VITALS — BP 108/74 | HR 91 | Temp 97.7°F

## 2021-09-25 DIAGNOSIS — J22 Unspecified acute lower respiratory infection: Secondary | ICD-10-CM

## 2021-09-25 DIAGNOSIS — R059 Cough, unspecified: Secondary | ICD-10-CM

## 2021-09-25 DIAGNOSIS — R7302 Impaired glucose tolerance (oral): Secondary | ICD-10-CM

## 2021-09-25 LAB — POC COVID19 BINAXNOW: SARS Coronavirus 2 Ag: NEGATIVE

## 2021-09-25 MED ORDER — LEVOFLOXACIN 500 MG PO TABS: 500.0000 mg | ORAL_TABLET | Freq: Every day | ORAL | 0 refills | Status: AC

## 2021-09-25 MED ORDER — LEVOFLOXACIN 500 MG PO TABS: 500.0000 mg | ORAL_TABLET | Freq: Every day | ORAL | 0 refills | Status: DC

## 2021-09-25 MED ORDER — METHYLPREDNISOLONE ACETATE 80 MG/ML IJ SUSP
80.0000 mg | Freq: Once | INTRAMUSCULAR | Status: AC
  Administered 2021-09-25: 80 mg via INTRAMUSCULAR

## 2021-09-25 NOTE — Progress Notes (Signed)
° °  Subjective:    Patient ID: Barbara [name], female    DOB: 28-Oct-1955, 66 y.o.   MRN: [id number]  HPI 66 year old Female seen with respiratory infection symptoms onset 4  days ago. Seems to have nasal congestion. Has discolored nasal draiange. Took Home Covid test yesterday that was negative.  Rapid COVID test here today is negative.  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  Seen in December for health maintenance exam.  She has a history of impaired glucose tolerance treated with diet.  Takes Lasix for dependent edema and has history of pure hypercholesterolemia treated with Crestor.  History of hypertension treated with losartan and furosemide.  Review of Systems no fever, had sore throat, has productive cough with green sputum,      Objective:   Physical Exam Blood pressure 108/74 pulse 91 temperature 97.7 pulse oximetry 97%  Skin: Warm and dry.  Nodes: None.  TMs clear.  Pharynx is slightly injected.  Neck supple.  Chest clear without rales or wheezing.       Assessment & Plan:  Acute lower respiratory infection-rapid COVID test here in the office is negative  Impaired glucose tolerance-treated with diet  Essential hypertension-stable  Plan: Patient will be treated with Levaquin 500 mg daily for 7 days.  Rest and drink fluids.  Was given Depo-Medrol 80 mg IM for respiratory congestion.  Says Dr. Corinda Gubler used to give her this injection years ago and this was helpful with respiratory congestion.  Call if not better in 1 week or sooner if worse.

## 2021-09-25 NOTE — Telephone Encounter (Signed)
scheduled

## 2021-09-25 NOTE — Patient Instructions (Signed)
Levaquin 500 mg daily x 7 days. Depomedrol 80 mg Im given for respiratory congestion. Rest and drink fluids. Call if not better in one week or sooner if worse. Covid test in office is negative.

## 2021-09-25 NOTE — Telephone Encounter (Signed)
Patient states that she needs to be seen. She has sinus infection, runny nose, cough, green mucous. She took covid test Sunday it was negative. Started feeing bad on Sunday. Please advise. She states this happens every year.

## 2021-10-27 ENCOUNTER — Other Ambulatory Visit: Payer: Self-pay | Admitting: Internal Medicine

## 2021-12-30 ENCOUNTER — Other Ambulatory Visit: Payer: Self-pay | Admitting: Internal Medicine

## 2022-01-28 ENCOUNTER — Other Ambulatory Visit: Payer: 59

## 2022-01-31 ENCOUNTER — Ambulatory Visit: Payer: 59 | Admitting: Internal Medicine

## 2022-02-04 ENCOUNTER — Ambulatory Visit: Payer: 59 | Admitting: Podiatry

## 2022-02-15 ENCOUNTER — Ambulatory Visit: Payer: 59 | Admitting: Internal Medicine

## 2022-02-26 ENCOUNTER — Other Ambulatory Visit: Payer: 59

## 2022-02-26 DIAGNOSIS — E1169 Type 2 diabetes mellitus with other specified complication: Secondary | ICD-10-CM

## 2022-03-01 ENCOUNTER — Ambulatory Visit: Payer: 59 | Admitting: Internal Medicine

## 2022-03-06 ENCOUNTER — Ambulatory Visit (INDEPENDENT_AMBULATORY_CARE_PROVIDER_SITE_OTHER): Payer: BC Managed Care – PPO

## 2022-03-06 ENCOUNTER — Encounter: Payer: Self-pay | Admitting: Podiatry

## 2022-03-06 ENCOUNTER — Ambulatory Visit: Payer: BC Managed Care – PPO | Admitting: Podiatry

## 2022-03-06 DIAGNOSIS — M21861 Other specified acquired deformities of right lower leg: Secondary | ICD-10-CM | POA: Diagnosis not present

## 2022-03-06 DIAGNOSIS — K319 Disease of stomach and duodenum, unspecified: Secondary | ICD-10-CM | POA: Insufficient documentation

## 2022-03-06 DIAGNOSIS — M7750 Other enthesopathy of unspecified foot: Secondary | ICD-10-CM | POA: Diagnosis not present

## 2022-03-06 DIAGNOSIS — M9261 Juvenile osteochondrosis of tarsus, right ankle: Secondary | ICD-10-CM

## 2022-03-06 DIAGNOSIS — M7661 Achilles tendinitis, right leg: Secondary | ICD-10-CM | POA: Diagnosis not present

## 2022-03-06 DIAGNOSIS — M7751 Other enthesopathy of right foot: Secondary | ICD-10-CM

## 2022-03-06 DIAGNOSIS — J301 Allergic rhinitis due to pollen: Secondary | ICD-10-CM | POA: Insufficient documentation

## 2022-03-06 MED ORDER — MELOXICAM 15 MG PO TABS: 15.0000 mg | ORAL_TABLET | Freq: Every day | ORAL | 3 refills | Status: DC

## 2022-03-06 MED ORDER — METHYLPREDNISOLONE 4 MG PO TBPK: ORAL_TABLET | ORAL | 0 refills | Status: DC

## 2022-03-06 NOTE — Patient Instructions (Signed)

## 2022-03-06 NOTE — Progress Notes (Signed)
  Subjective:  Patient ID: Barbara [name], female    DOB: August 13, 1955,  MRN: [id number]  Chief Complaint  Patient presents with   Tendonitis    NP - R ANKLE SWOLLEN AND PAINFUL -Lateral side and achilles pain x 6 months or more. Tried new shoes, taking fluid pill. Sometimes the swelling will go down, sometimes it won't.    66 y.o. female presents with the above complaint. History confirmed with patient.   Objective:  Physical Exam: warm, good capillary refill, no trophic changes or ulcerative lesions, normal DP and PT pulses, and normal sensory exam. Left Foot: normal exam, no swelling, tenderness, instability; ligaments intact, full range of motion of all ankle/foot joints Right Foot: tenderness at Achilles tendon insertion and gastrocnemius equinus is noted with a positive silverskiold test  No images are attached to the encounter.  Radiographs: Multiple views x-ray of the right foot: no fracture, dislocation, swelling or degenerative changes noted, posterior calcaneal spur, and Haglund deformity noted Assessment:   1. Achilles tendinitis, right leg   2. Gastrocnemius equinus of right lower extremity   3. Haglund's deformity, right      Plan:  Patient was evaluated and treated and all questions answered.  Discussed the etiology and treatment options for Achilles tendinitis including stretching, formal physical therapy with an eccentric exercises therapy plan, supportive shoegears such as a running shoe or sneaker, heel lifts, topical and oral medications.  We also discussed that I do not routinely perform injections in this area because of the risk of an increased damage or rupture of the tendon.  We also discussed the role of surgical treatment of this for patients who do not improve after exhausting non-surgical treatment options.  -XR reviewed with patient -Educated on stretching and icing of the affected limb. -Rx for Medrol 6-day taper. Advised on risks, benefits, and  alternatives of the medication -Rx for Meloxicam. Advised on risks, benefits, and alternatives of the medication.  She will begin this after finishing the Medrol Dosepak -Home therapy plan given -Recommend immobilizing in a cam boot.  This was dispensed today to offload the Achilles tendon.  Hopeful transition to shoes after next visit   Return in about 1 month (around 04/06/2022) for re-check Achilles tendon.

## 2022-03-08 ENCOUNTER — Telehealth: Payer: Self-pay | Admitting: *Deleted

## 2022-03-08 NOTE — Telephone Encounter (Signed)
Patient came in to switch out boot, too small, tried on new one and stated that it fits much better.signed DME financial form today as well.

## 2022-04-10 ENCOUNTER — Ambulatory Visit (INDEPENDENT_AMBULATORY_CARE_PROVIDER_SITE_OTHER): Payer: 59 | Admitting: Podiatry

## 2022-04-10 DIAGNOSIS — M9261 Juvenile osteochondrosis of tarsus, right ankle: Secondary | ICD-10-CM

## 2022-04-10 DIAGNOSIS — M7661 Achilles tendinitis, right leg: Secondary | ICD-10-CM | POA: Diagnosis not present

## 2022-04-10 DIAGNOSIS — M21861 Other specified acquired deformities of right lower leg: Secondary | ICD-10-CM | POA: Diagnosis not present

## 2022-04-10 NOTE — Progress Notes (Signed)
  Subjective:  Patient ID: Barbara [name], female    DOB: [birth year],  MRN: [id number]  Chief Complaint  Patient presents with   Tendonitis    4 week follow up Achilles tendonitis right. Doing better, frustrated with the boot    66 y.o. female presents with the above complaint. History confirmed with patient.  She has had quite a bit of improvement probably 75% better  Objective:  Physical Exam: warm, good capillary refill, no trophic changes or ulcerative lesions, normal DP and PT pulses, and normal sensory exam. Left Foot: normal exam, no swelling, tenderness, instability; ligaments intact, full range of motion of all ankle/foot joints Right Foot: tenderness at Achilles tendon insertion and gastrocnemius equinus is noted with a positive silverskiold test, has improved quite a bit  No images are attached to the encounter.  Radiographs: Multiple views x-ray of the right foot: no fracture, dislocation, swelling or degenerative changes noted, posterior calcaneal spur, and Haglund deformity noted Assessment:   1. Achilles tendinitis, right leg   2. Gastrocnemius equinus of right lower extremity   3. Haglund's deformity, right       Plan:  Patient was evaluated and treated and all questions answered.  Discussed the etiology and treatment options for Achilles tendinitis including stretching, formal physical therapy with an eccentric exercises therapy plan, supportive shoegears such as a running shoe or sneaker, heel lifts, topical and oral medications.  We also discussed that I do not routinely perform injections in this area because of the risk of an increased damage or rupture of the tendon.  We also discussed the role of surgical treatment of this for patients who do not improve after exhausting non-surgical treatment options.  Overall doing very well.  I recommend she continue her home physical therapy plan for the next 4 to 6 weeks.  I dispensed her felt heel lifts and she will  wear to wear these for the next 2 weeks in a supportive shoe, then one of them for 2 weeks after that in a supportive shoe and then we will go in a supportive shoe only with no left.  Meloxicam she can continue to take as needed.  I will see her back as needed.  Advised her if its not better in about 6 weeks or worsens that she should let me know, MRI would be indicated if it does not improve by that point.  Return if symptoms worsen or fail to improve.

## 2022-05-18 ENCOUNTER — Other Ambulatory Visit: Payer: Self-pay | Admitting: Internal Medicine

## 2022-05-24 ENCOUNTER — Ambulatory Visit: Payer: 59 | Admitting: Internal Medicine

## 2022-05-29 ENCOUNTER — Telehealth: Payer: Self-pay | Admitting: Podiatry

## 2022-05-29 DIAGNOSIS — M7661 Achilles tendinitis, right leg: Secondary | ICD-10-CM

## 2022-05-29 DIAGNOSIS — M9261 Juvenile osteochondrosis of tarsus, right ankle: Secondary | ICD-10-CM

## 2022-05-29 NOTE — Telephone Encounter (Signed)
Patient called this morning her R ankle is inflamed again and tender , she would like to know what you recommend her to do until she can come into office on October 30th?  Should she resume wearing the boot ?

## 2022-06-10 ENCOUNTER — Ambulatory Visit: Payer: 59 | Admitting: Podiatry

## 2022-06-10 ENCOUNTER — Ambulatory Visit
Admission: RE | Admit: 2022-06-10 | Discharge: 2022-06-10 | Disposition: A | Discharge status: Home / Self Care | Payer: 59 | Source: Ambulatory Visit | Attending: Podiatry | Admitting: Podiatry

## 2022-06-10 DIAGNOSIS — M71571 Other bursitis, not elsewhere classified, right ankle and foot: Secondary | ICD-10-CM | POA: Diagnosis not present

## 2022-06-10 DIAGNOSIS — M7661 Achilles tendinitis, right leg: Secondary | ICD-10-CM

## 2022-06-10 DIAGNOSIS — M9261 Juvenile osteochondrosis of tarsus, right ankle: Secondary | ICD-10-CM

## 2022-06-10 DIAGNOSIS — M19071 Primary osteoarthritis, right ankle and foot: Secondary | ICD-10-CM | POA: Diagnosis not present

## 2022-06-12 DIAGNOSIS — Z1231 Encounter for screening mammogram for malignant neoplasm of breast: Secondary | ICD-10-CM | POA: Diagnosis not present

## 2022-06-12 DIAGNOSIS — Z01419 Encounter for gynecological examination (general) (routine) without abnormal findings: Secondary | ICD-10-CM | POA: Diagnosis not present

## 2022-06-12 DIAGNOSIS — Z6841 Body Mass Index (BMI) 40.0 and over, adult: Secondary | ICD-10-CM | POA: Diagnosis not present

## 2022-06-12 LAB — HM MAMMOGRAPHY

## 2022-06-13 ENCOUNTER — Other Ambulatory Visit: Payer: 59

## 2022-06-13 ENCOUNTER — Ambulatory Visit (INDEPENDENT_AMBULATORY_CARE_PROVIDER_SITE_OTHER): Payer: 59 | Admitting: Podiatry

## 2022-06-13 DIAGNOSIS — M9261 Juvenile osteochondrosis of tarsus, right ankle: Secondary | ICD-10-CM | POA: Diagnosis not present

## 2022-06-13 DIAGNOSIS — R7302 Impaired glucose tolerance (oral): Secondary | ICD-10-CM

## 2022-06-13 DIAGNOSIS — E1169 Type 2 diabetes mellitus with other specified complication: Secondary | ICD-10-CM

## 2022-06-13 DIAGNOSIS — M7661 Achilles tendinitis, right leg: Secondary | ICD-10-CM | POA: Diagnosis not present

## 2022-06-13 MED ORDER — MELOXICAM 15 MG PO TABS: 15.0000 mg | ORAL_TABLET | Freq: Every day | ORAL | 3 refills | Status: DC

## 2022-06-13 NOTE — Progress Notes (Signed)
  Subjective:  Patient ID: Barbara [name], female    DOB: Jan 19, 1956,  MRN: [id number]  Chief Complaint  Patient presents with   Tendonitis    R ankle is inflamed again MRI results/ pt needs to be seen as early as possible as she was originally scheduled at 62 and has another appt this morning..dm    66 y.o. female presents with the above complaint. History confirmed with patient.  It is worsened and she has completed the MRI this week  Objective:  Physical Exam: warm, good capillary refill, no trophic changes or ulcerative lesions, normal DP and PT pulses, and normal sensory exam. Left Foot: normal exam, no swelling, tenderness, instability; ligaments intact, full range of motion of all ankle/foot joints Right Foot: Tenderness at the distal Gillies insertion and anterior to the tendon there is significant edema here  No images are attached to the encounter.  Radiographs: Multiple views x-ray of the right foot: no fracture, dislocation, swelling or degenerative changes noted, posterior calcaneal spur, and Haglund deformity noted  IMPRESSION: 1. Moderate tendinosis of the Achilles tendon. Severe retrocalcaneal bursitis. 2. Mild tendinosis of the peroneus longus with a longitudinal split tear. 3. 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 4. Mild osteoarthritis of the third TMT joint. 5. Mild osteoarthritis of the second TMT joint.     Electronically Signed   By: Kathreen Devoid M.D.   On: 06/13/2022 10:42 Assessment:   1. Achilles bursitis of right lower extremity   2. Achilles tendinitis, right leg   3. Haglund's deformity, right       Plan:  Patient was evaluated and treated and all questions answered.  We reviewed the results of her MRI.  During the time of her visit the report was not readily available but I did reviewed the images with her and gave her my impression.  There is significant amount of bursitis in the area of Kager's triangle as well  as a significant Haglund deformity.  There is some tendinosis of the mid substance of the Achilles as well.  We discussed further treatment of this including anti-inflammatories and returning to her cam walker boot which she will do.  Meloxicam Rx sent to pharmacy.  She will return in 1 week for injection therapy we discussed the risks and benefits of this including the possibility of damage or rupture of the tendon and therefore I recommended she be in her boot following this which she did not have today.  Finally we also discussed that if it did not improve there are surgical options to treat this with a would be quite extensive and required a lengthy amount of time off her feet for more.  No follow-ups on file.

## 2022-06-14 ENCOUNTER — Other Ambulatory Visit: Payer: 59

## 2022-06-14 LAB — LIPID PANEL
Cholesterol: 230 mg/dL — ABNORMAL HIGH
HDL: 46 mg/dL — ABNORMAL LOW
LDL Cholesterol (Calc): 164 mg/dL — ABNORMAL HIGH
Non-HDL Cholesterol (Calc): 184 mg/dL — ABNORMAL HIGH
Total CHOL/HDL Ratio: 5 (calc) — ABNORMAL HIGH
Triglycerides: 92 mg/dL

## 2022-06-14 LAB — HEMOGLOBIN A1C
Hgb A1c MFr Bld: 6.1 % of total Hgb — ABNORMAL HIGH (ref <5.7)
Mean Plasma Glucose: 128 mg/dL
eAG (mmol/L): 7.1 mmol/L

## 2022-06-18 ENCOUNTER — Telehealth: Payer: Self-pay | Admitting: Podiatry

## 2022-06-18 ENCOUNTER — Ambulatory Visit (INDEPENDENT_AMBULATORY_CARE_PROVIDER_SITE_OTHER): Payer: BC Managed Care – PPO | Admitting: Podiatry

## 2022-06-18 ENCOUNTER — Other Ambulatory Visit: Payer: Self-pay

## 2022-06-18 DIAGNOSIS — M7661 Achilles tendinitis, right leg: Secondary | ICD-10-CM

## 2022-06-18 NOTE — Telephone Encounter (Signed)
Pt stated [facility] hasnt received the referral.  Please advise

## 2022-06-18 NOTE — Telephone Encounter (Signed)
Referral was faxed to [facility] location today. It was not clarified which [facility] it was supposed to be sent to.

## 2022-06-18 NOTE — Telephone Encounter (Signed)
Ok it was faxed today.

## 2022-06-19 NOTE — Progress Notes (Signed)
  Subjective:  Patient ID: Barbara [name], female    DOB: 06/03/1956,  MRN: [id number]  Chief Complaint  Patient presents with   Tendonitis    Follow up Achilles bursitis right - injection per Dr. Lilian Kapur    66 y.o. female presents with the above complaint. History confirmed with patient.  She returns for follow-up for injection  Objective:  Physical Exam: warm, good capillary refill, no trophic changes or ulcerative lesions, normal DP and PT pulses, and normal sensory exam. Left Foot: normal exam, no swelling, tenderness, instability; ligaments intact, full range of motion of all ankle/foot joints Right Foot: Tenderness at the distal Achilles insertion and anterior to the tendon there is significant edema here  No images are attached to the encounter.  Radiographs: Multiple views x-ray of the right foot: no fracture, dislocation, swelling or degenerative changes noted, posterior calcaneal spur, and Haglund deformity noted  IMPRESSION: 1. Moderate tendinosis of the Achilles tendon. Severe retrocalcaneal bursitis. 2. Mild tendinosis of the peroneus longus with a longitudinal split tear. 3. 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 4. Mild osteoarthritis of the third TMT joint. 5. Mild osteoarthritis of the second TMT joint.     Electronically Signed   By: Elige Ko M.D.   On: 06/13/2022 10:42 Assessment:   1. Achilles bursitis of right lower extremity       Plan:  Patient was evaluated and treated and all questions answered.  She presented today for injection of the retro-Achilles bursa notable on her MRI in Kager's triangle.  Following sterile prep with Betadine the bursa was injected with 4 mg of dexamethasone and 1 cc of Marcaine.  She tolerated as well as dressed with a Band-Aid.  She will wear the CAM boot this was dispensed with exchange of her previous defective 1 that did not fit properly.  I will see her back in 1 month for follow-up  No  follow-ups on file.

## 2022-06-21 ENCOUNTER — Ambulatory Visit: Payer: BC Managed Care – PPO | Admitting: Internal Medicine

## 2022-06-21 ENCOUNTER — Encounter: Payer: Self-pay | Admitting: Internal Medicine

## 2022-06-21 VITALS — BP 122/74 | HR 63 | Temp 97.9°F | Ht 65.0 in | Wt 253.8 lb

## 2022-06-21 DIAGNOSIS — R7302 Impaired glucose tolerance (oral): Secondary | ICD-10-CM

## 2022-06-21 DIAGNOSIS — E785 Hyperlipidemia, unspecified: Secondary | ICD-10-CM

## 2022-06-21 DIAGNOSIS — E1169 Type 2 diabetes mellitus with other specified complication: Secondary | ICD-10-CM

## 2022-06-21 DIAGNOSIS — Z6841 Body Mass Index (BMI) 40.0 and over, adult: Secondary | ICD-10-CM

## 2022-06-21 DIAGNOSIS — M7661 Achilles tendinitis, right leg: Secondary | ICD-10-CM

## 2022-06-21 DIAGNOSIS — E7800 Pure hypercholesterolemia, unspecified: Secondary | ICD-10-CM

## 2022-06-21 NOTE — Patient Instructions (Addendum)
Refer to [facility] Dr. Victorino Dike.Not able to exercise because of Achilles injury. RTC in 6 months. Vaccines discussed.

## 2022-06-21 NOTE — Progress Notes (Signed)
   Subjective:    Patient ID: Barbara [name], female    DOB: 1956-02-19, 66 y.o.   MRN: [id number]  HPI  66 year old Female seen with inflammation of right Achilles tendon.  She has tried steroid taper and Meloxicam.  Has been seeing podiatrist.  Patient has had issues with right foot. Does not recall an injury. 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  Had mammogram at GYN last week.([facility]) on November 1st  She is due for health maintenance exam in the near future.  She has a history of impaired glucose tolerance, vitamin D deficiency, dependent edema, pure hypercholesterolemia.  History of cervical disc disease seen by Dr. Franky Macho in 2010 and treated conservatively.  Unfortunately her total cholesterol is 230, LDL is 164.  HDL is 46.  Triglycerides are normal at 92. She has declined statin therapy in the past.  I would like for her to reconsider that.  Hemoglobin A1c is 6.1% and was 6.0% in December 2022.   Review of Systems she has gotten frustrated with her Achilles injury.  We discussed further evaluation with orthopedist.  He is not able to exercise because of Achilles injury.  Vaccines discussed with her today.  She does not want to take statin medication.     Objective:   Physical Exam Vital signs reviewed.  BMI 42.23 weight 253 pounds 12.8 ounces pulse 63, regular she is afebrile. Skin: Warm and dry.  No cervical adenopathy, thyromegaly or carotid bruits.  Chest is clear.  Cardiac exam: Regular rate and rhythm.  Trace lower extremity edema.  Decreased range of motion with flexion and extension of right foot      Assessment & Plan:  History of Achilles tendinitis right lower extremity-patient would like to see orthopedist and referral will be made to Dr. Victorino Dike  BMI 42.23-unable to exercise due to issue with right Achilles tendinitis  Hyperlipidemia-she has declined statin therapy in the past.  I have asked her to reconsider.  Impaired glucose  tolerance-hemoglobin A1c 6.1%  Plan: Referral to [facility], Dr. Victorino Dike for evaluation of Achilles tendinitis.  Continue meloxicam for now.  I am recommending Crestor 10 mg daily for hyperlipidemia.  I have placed an order for Crestor and she has health maintenance exam in April.  She would like some time to work on diet and exercise.

## 2022-06-22 LAB — MICROALBUMIN / CREATININE URINE RATIO
Creatinine, Urine: 155 mg/dL (ref 20–275)
Microalb Creat Ratio: 7 mcg/mg creat (ref <30)
Microalb, Ur: 1.1 mg/dL

## 2022-06-28 ENCOUNTER — Other Ambulatory Visit: Payer: Self-pay | Admitting: Internal Medicine

## 2022-06-28 NOTE — Telephone Encounter (Signed)
Is it ok to refill Losartan

## 2022-07-08 DIAGNOSIS — M7661 Achilles tendinitis, right leg: Secondary | ICD-10-CM | POA: Diagnosis not present

## 2022-07-08 DIAGNOSIS — M898X7 Other specified disorders of bone, ankle and foot: Secondary | ICD-10-CM | POA: Diagnosis not present

## 2022-07-08 DIAGNOSIS — M6701 Short Achilles tendon (acquired), right ankle: Secondary | ICD-10-CM | POA: Diagnosis not present

## 2022-07-16 DIAGNOSIS — M7661 Achilles tendinitis, right leg: Secondary | ICD-10-CM | POA: Diagnosis not present

## 2022-07-23 DIAGNOSIS — M7661 Achilles tendinitis, right leg: Secondary | ICD-10-CM | POA: Diagnosis not present

## 2022-07-30 ENCOUNTER — Ambulatory Visit: Payer: 59 | Admitting: Podiatry

## 2022-07-31 DIAGNOSIS — M7661 Achilles tendinitis, right leg: Secondary | ICD-10-CM | POA: Diagnosis not present

## 2022-08-07 DIAGNOSIS — M7661 Achilles tendinitis, right leg: Secondary | ICD-10-CM | POA: Diagnosis not present

## 2022-08-13 DIAGNOSIS — M7661 Achilles tendinitis, right leg: Secondary | ICD-10-CM | POA: Diagnosis not present

## 2022-08-14 ENCOUNTER — Telehealth: Payer: Self-pay | Admitting: Internal Medicine

## 2022-08-14 ENCOUNTER — Other Ambulatory Visit: Payer: Self-pay

## 2022-08-14 ENCOUNTER — Encounter: Payer: Self-pay | Admitting: Internal Medicine

## 2022-08-14 ENCOUNTER — Ambulatory Visit (INDEPENDENT_AMBULATORY_CARE_PROVIDER_SITE_OTHER): Payer: 59 | Admitting: Internal Medicine

## 2022-08-14 ENCOUNTER — Telehealth: Payer: Self-pay

## 2022-08-14 VITALS — BP 126/82 | HR 64 | Temp 98.3°F

## 2022-08-14 DIAGNOSIS — J22 Unspecified acute lower respiratory infection: Secondary | ICD-10-CM

## 2022-08-14 DIAGNOSIS — E119 Type 2 diabetes mellitus without complications: Secondary | ICD-10-CM

## 2022-08-14 DIAGNOSIS — J301 Allergic rhinitis due to pollen: Secondary | ICD-10-CM

## 2022-08-14 MED ORDER — LEVOFLOXACIN 500 MG PO TABS: 500.0000 mg | ORAL_TABLET | Freq: Every day | ORAL | 0 refills | Status: DC

## 2022-08-14 MED ORDER — HYDROCODONE BIT-HOMATROP MBR 5-1.5 MG/5ML PO SOLN: 5.0000 mL | Freq: Three times a day (TID) | ORAL | 0 refills | Status: DC | PRN

## 2022-08-14 MED ORDER — HYDROCOD POLI-CHLORPHE POLI ER 10-8 MG/5ML PO SUER: 5.0000 mL | Freq: Two times a day (BID) | ORAL | 0 refills | Status: DC | PRN

## 2022-08-14 MED ORDER — FLUTICASONE PROPIONATE 50 MCG/ACT NA SUSP: 2.0000 | Freq: Every day | NASAL | 6 refills | Status: AC

## 2022-08-14 NOTE — Telephone Encounter (Signed)
Rx is out of stock   chlorpheniramine-HYDROcodone ([place]) 10-8 MG/59M

## 2022-08-14 NOTE — Telephone Encounter (Signed)
scheduled

## 2022-08-14 NOTE — Telephone Encounter (Signed)
Barbara [name] [phone]  Earlie Server had sent [facility] message, that she was coughing and had some mucus. I called her she started out with fever and scratchy throat on Thursday and done COVID test on Friday that was negative, she now has has cough, mucus and just does not feel good, I have ask her to go ahead a do another COVID test and call me back. I scheduled her for a visit at 10:00. The test will determine what type of visit.

## 2022-08-14 NOTE — Progress Notes (Signed)
   Subjective:    Patient ID: Barbara [name], female    DOB: 1956/03/15, 67 y.o.   MRN: [id number]  HPI 67 year old Female seen with fever and cough onset late December. Covid test done then was negative. Another Covid test today is negative. Has slight sore throat. Has some sputum production with cough. No headache nausea or vomiting. Says symptoms have improved some.  She works at [employer] so she may have been exposed to a respiratory illness there. She resides alone. Has impaired glucose tolerance and hyperlipidemia. BMI is 41.6  No recent vaccines for Covid or flu by our records.      Review of Systems no nausea, vomiting , shaking chills. diarrhea     Objective:   Physical Exam  Blood pressure 126/82, pulse 64, temperature 98.3 degrees, pulse oximetry 98% on room air  Skin: Warm and dry.  No cervical adenopathy.  TMs are clear.  Neck supple.  Chest clear.  No rales or wheezing appreciated.      Assessment & Plan:   Acute lower respiratory infection  Glucose intolerance  Hyperlipidemia  Essential hypertension  BMI 41  Plan: Tussionex 1 teaspoon every 12 hours as needed for cough.  Stay well-hydrated and rest.  Level 500 mg daily with a meal for 7 days.

## 2022-08-24 NOTE — Patient Instructions (Addendum)
Tussionex 1 teaspoon every 12 hours as needed for cough.  Levaquin 500 milligrams daily for 7 days.  Rest and stay well-hydrated.  Call if develops significant shortness of breath or not improving within a few days.

## 2022-08-28 DIAGNOSIS — M7661 Achilles tendinitis, right leg: Secondary | ICD-10-CM | POA: Diagnosis not present

## 2022-08-28 DIAGNOSIS — M898X7 Other specified disorders of bone, ankle and foot: Secondary | ICD-10-CM | POA: Diagnosis not present

## 2022-08-28 DIAGNOSIS — M6701 Short Achilles tendon (acquired), right ankle: Secondary | ICD-10-CM | POA: Diagnosis not present

## 2022-09-12 ENCOUNTER — Encounter: Payer: Self-pay | Admitting: Internal Medicine

## 2022-09-12 ENCOUNTER — Ambulatory Visit (INDEPENDENT_AMBULATORY_CARE_PROVIDER_SITE_OTHER): Payer: BC Managed Care – PPO | Admitting: Internal Medicine

## 2022-09-12 ENCOUNTER — Other Ambulatory Visit: Payer: Self-pay

## 2022-09-12 ENCOUNTER — Telehealth: Payer: Self-pay | Admitting: Internal Medicine

## 2022-09-12 VITALS — BP 120/74 | HR 78 | Temp 98.1°F | Ht 65.0 in | Wt 251.4 lb

## 2022-09-12 DIAGNOSIS — E119 Type 2 diabetes mellitus without complications: Secondary | ICD-10-CM

## 2022-09-12 DIAGNOSIS — R350 Frequency of micturition: Secondary | ICD-10-CM | POA: Diagnosis not present

## 2022-09-12 DIAGNOSIS — R319 Hematuria, unspecified: Secondary | ICD-10-CM

## 2022-09-12 LAB — POCT URINALYSIS DIPSTICK
Bilirubin, UA: NEGATIVE
Glucose, UA: NEGATIVE
Ketones, UA: NEGATIVE
Nitrite, UA: NEGATIVE
Protein, UA: NEGATIVE
Spec Grav, UA: 1.015 (ref 1.010–1.025)
Urobilinogen, UA: 0.2 E.U./dL
pH, UA: 6 (ref 5.0–8.0)

## 2022-09-12 MED ORDER — ERGOCALCIFEROL 1.25 MG (50000 UT) PO CAPS: 50000.0000 [IU] | ORAL_CAPSULE | ORAL | 3 refills | Status: DC

## 2022-09-12 MED ORDER — CIPROFLOXACIN HCL 500 MG PO TABS: 500.0000 mg | ORAL_TABLET | Freq: Two times a day (BID) | ORAL | 0 refills | Status: DC

## 2022-09-12 NOTE — Patient Instructions (Addendum)
Cipro 500 mg twice daily x 7 days pending urine culture results  Addendum: Mixed flora isolated on urine culture but pt has improved. MJB,MD

## 2022-09-12 NOTE — Progress Notes (Signed)
Patient Care Team: Elby Showers, MD as PCP - General (Internal Medicine)  Visit Date: 09/12/22  Subjective:    Patient ID: Barbara [name] , Female   DOB: 06-04-1956, 67 y.o.    MRN: [id number]   66 y.o. Female presents today for urinary urgency and lower back pain. Patient has a past medical history of diabetes mellitus, anemia, arthritis, hyperlipidemia, leg swelling, low back pain, obesity.  She reports experiencing urinary urgency, lower back pain since this morning. Denies burning. Denies taking AZO standard.  She is interested in a referral for a weight loss clinic.   Past Medical History:  Diagnosis Date   Allergy    allergic rhinitis   Anemia    Arthritis    l k nee osteo   Diabetes mellitus without complication (HCC)    diet controlled   Hyperlipidemia    Leg swelling    a. RLE edema, intermittent, 2 negative duplexes in 2015.   Low back pain    Obesity      Family History  Problem Relation Age of Onset   Asthma Other    Hypertension Other    Hyperlipidemia Other    Stroke Other    Heart disease Mother    Hypertension Mother    Stroke Mother    Diabetes Brother     Social History   Social History Narrative   Social history: Does not smoke or consume alcohol.  Resides alone.  Has a college degree.  Has worked at [employer] for over 30 years.  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       Family history: Father deceased with history of asthma.  Mother with history of CVA and hypertension.  1 brother and 2 sisters in good health.          Review of Systems  Constitutional:  Negative for chills, fever and malaise/fatigue.  HENT:  Negative for congestion.   Eyes:  Negative for blurred vision.  Respiratory:  Negative for cough and shortness of breath.   Cardiovascular:  Negative for chest pain, palpitations and leg swelling.  Gastrointestinal:  Negative for nausea and vomiting.  Genitourinary:  Positive for frequency and urgency. Negative for  dysuria.  Musculoskeletal:  Positive for back pain (Lower).  Skin:  Negative for rash.  Neurological:  Negative for loss of consciousness and headaches.        Objective:   Vitals: BP 120/74   Pulse 78   Temp 98.1 F (36.7 C) (Tympanic)   Ht 5' 5"$  (1.651 m)   Wt 251 lb 6.4 oz (114 kg)   SpO2 99%   BMI 41.84 kg/m    Physical Exam Vitals and nursing note reviewed.  Constitutional:      General: She is not in acute distress.    Appearance: Normal appearance. She is not toxic-appearing.  HENT:     Head: Normocephalic and atraumatic.  Pulmonary:     Effort: Pulmonary effort is normal.  Genitourinary:    Comments: Left-sided renal tenderness. Skin:    General: Skin is warm and dry.  Neurological:     Mental Status: She is alert and oriented to person, place, and time. Mental status is at baseline.  Psychiatric:        Mood and Affect: Mood normal.        Behavior: Behavior normal.        Thought Content: Thought content normal.        Judgment: Judgment normal.  Results:   Studies obtained and personally reviewed by me:    Labs:       Component Value Date/Time   NA 142 07/19/2021 1032   K 4.5 07/19/2021 1032   CL 106 07/19/2021 1032   CO2 28 07/19/2021 1032   GLUCOSE 87 07/19/2021 1032   BUN 15 07/19/2021 1032   CREATININE 0.68 07/19/2021 1032   CALCIUM 9.0 07/19/2021 1032   PROT 6.8 07/19/2021 1032   ALBUMIN 4.2 04/20/2015 0905   AST 14 07/19/2021 1032   ALT 9 07/19/2021 1032   ALKPHOS 82 04/20/2015 0905   BILITOT 0.4 07/19/2021 1032   GFRNONAA 84 06/22/2020 1105   GFRAA 97 06/22/2020 1105     Lab Results  Component Value Date   WBC 6.2 07/19/2021   HGB 11.2 (L) 07/19/2021   HCT 37.2 07/19/2021   MCV 73.4 (L) 07/19/2021   PLT 345 07/19/2021    Lab Results  Component Value Date   CHOL 230 (H) 06/13/2022   HDL 46 (L) 06/13/2022   LDLCALC 164 (H) 06/13/2022   TRIG 92 06/13/2022   CHOLHDL 5.0 (H) 06/13/2022    Lab Results   Component Value Date   HGBA1C 6.1 (H) 06/13/2022     Lab Results  Component Value Date   TSH 2.10 07/19/2021      Assessment & Plan:   Possible Bladder Infection: Urine culture ordered with results expected in two business days. Prescribed Cipro 500 mg twice daily for 7 days.  Obesity: Given information for [place] weight loss clinics.    I,Alexander Ruley,acting as a Education administrator for Elby Showers, MD.,have documented all relevant documentation on the behalf of Elby Showers, MD,as directed by  Elby Showers, MD while in the presence of Elby Showers, MD.   I, Elby Showers, MD, have reviewed all documentation for this visit. The documentation on 09/22/22 for the exam, diagnosis, procedures, and orders are all accurate and complete.

## 2022-09-12 NOTE — Telephone Encounter (Signed)
[name] [phone]  Barbara [name] called to say her lower back has been hurting for couple of days and she also has urgency going lot more often. Thinks she might have UTI, so I scheduled her for appointment at 4:30 today.

## 2022-09-13 LAB — URINALYSIS, MICROSCOPIC ONLY
Bacteria, UA: NONE SEEN /HPF
Hyaline Cast: NONE SEEN /LPF
Squamous Epithelial / HPF: NONE SEEN /HPF
WBC, UA: NONE SEEN /HPF (ref 0–5)

## 2022-09-13 LAB — URINE CULTURE
MICRO NUMBER:: 14505746
SPECIMEN QUALITY:: ADEQUATE

## 2022-09-22 ENCOUNTER — Encounter: Payer: Self-pay | Admitting: Internal Medicine

## 2022-11-12 NOTE — Progress Notes (Shared)
Patient Care Team: Elby Showers, MD as PCP - General (Internal Medicine)  Visit Date: 11/12/22  Subjective:    Patient ID: Barbara [name] , Female   DOB: August 16, 1955, 67 y.o.    MRN: [id number]   66 y.o. Female presents today for annual comprehensive physical exam. Patient has a past medical history of allergies, anemia, arthritis, diabetes mellitus, hyperlipidemia, intermitted RLE edema, lower back pain, obesity.  History of arthritis treated with Mobic 15 mg daily.  History of hypertension treated with Cozaar 25 mg daily.  History of dependent edema treated with Lasix 20 mg daily.  History of Vitamin D deficiency treated with Drisdol 50,000 units once weekly.  History of hyperlipidemia treated with Crestor 10 mg daily.  Pap smear last completed 05/21/19. Recommended repeat in 2023.  Mammogram last completed 05/15/21. Recommended repeat in 2024.  Colonoscopy last completed 2012 at [place] and reported as normal. Recommended repeat in 2027.    Vaccine counseling: UTD on Covid-19 vaccine, flu, shingles, tetanus vaccines.  Past Medical History:  Diagnosis Date   Allergy    allergic rhinitis   Anemia    Arthritis    l k nee osteo   Diabetes mellitus without complication (HCC)    diet controlled   Hyperlipidemia    Leg swelling    a. RLE edema, intermittent, 2 negative duplexes in 2015.   Low back pain    Obesity      Family History  Problem Relation Age of Onset   Asthma Other    Hypertension Other    Hyperlipidemia Other    Stroke Other    Heart disease Mother    Hypertension Mother    Stroke Mother    Diabetes Brother     Social History   Social History Narrative   Social history: Does not smoke or consume alcohol.  Resides alone.  Has a college degree.  Has worked at [employer] for over 30 years.  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       Family history: Father deceased with history of asthma.  Mother with history of CVA and hypertension.  1  brother and 2 sisters in good health.          ROS      Objective:   Vitals: There were no vitals taken for this visit.   Physical Exam    Results:   Studies obtained and personally reviewed by me:  Imaging, colonoscopy, mammogram, bone density scan, echocardiogram, heart cath, stress test, CT calcium score, etc. ***   Labs:       Component Value Date/Time   NA 142 07/19/2021 1032   K 4.5 07/19/2021 1032   CL 106 07/19/2021 1032   CO2 28 07/19/2021 1032   GLUCOSE 87 07/19/2021 1032   BUN 15 07/19/2021 1032   CREATININE 0.68 07/19/2021 1032   CALCIUM 9.0 07/19/2021 1032   PROT 6.8 07/19/2021 1032   ALBUMIN 4.2 04/20/2015 0905   AST 14 07/19/2021 1032   ALT 9 07/19/2021 1032   ALKPHOS 82 04/20/2015 0905   BILITOT 0.4 07/19/2021 1032   GFRNONAA 84 06/22/2020 1105   GFRAA 97 06/22/2020 1105     Lab Results  Component Value Date   WBC 6.2 07/19/2021   HGB 11.2 (L) 07/19/2021   HCT 37.2 07/19/2021   MCV 73.4 (L) 07/19/2021   PLT 345 07/19/2021    Lab Results  Component Value Date   CHOL 230 (H) 06/13/2022  HDL 46 (L) 06/13/2022   LDLCALC 164 (H) 06/13/2022   TRIG 92 06/13/2022   CHOLHDL 5.0 (H) 06/13/2022    Lab Results  Component Value Date   HGBA1C 6.1 (H) 06/13/2022     Lab Results  Component Value Date   TSH 2.10 07/19/2021     No results found for: "PSA1", "PSA" *** delete for female pts  ***    Assessment & Plan:   ***    I,Alexander Ruley,acting as a scribe for Elby Showers, MD.,have documented all relevant documentation on the behalf of Elby Showers, MD,as directed by  Elby Showers, MD while in the presence of Elby Showers, MD.   ***

## 2022-11-15 ENCOUNTER — Other Ambulatory Visit: Payer: 59

## 2022-11-15 DIAGNOSIS — R5383 Other fatigue: Secondary | ICD-10-CM

## 2022-11-15 DIAGNOSIS — E119 Type 2 diabetes mellitus without complications: Secondary | ICD-10-CM

## 2022-11-15 DIAGNOSIS — E7800 Pure hypercholesterolemia, unspecified: Secondary | ICD-10-CM

## 2022-11-15 DIAGNOSIS — I10 Essential (primary) hypertension: Secondary | ICD-10-CM

## 2022-11-17 ENCOUNTER — Other Ambulatory Visit: Payer: Self-pay | Admitting: Internal Medicine

## 2022-11-19 ENCOUNTER — Encounter: Payer: 59 | Admitting: Internal Medicine

## 2023-03-03 ENCOUNTER — Ambulatory Visit: Payer: Self-pay | Admitting: Internal Medicine

## 2023-05-15 NOTE — Progress Notes (Signed)
Annual Wellness Visit    Patient Care Team: Margaree Mackintosh, MD as PCP - General (Internal Medicine)  Visit Date: 05/23/23   Chief Complaint  Patient presents with   Medicare Wellness   Annual Exam    Subjective:   Patient: Barbara [name], Female    DOB: 02-16-1956, 67 y.o.   MRN: [id number]  Barbara [name] is a 67 y.o. Female who presents today for Welcome to Medicare physical exam. History of allergic rhinitis, anemia, arthritis, Type 2 diabetes mellitus, hyperlipidemia, lower back pain, obesity.  History of Type 2 diabetes mellitus that is currently controlled with diet and exercise. HGBA1c at 6.0% on 05/20/23, down from 6.1% on 06/13/22.  History of hyperlipidemia treated with rosuvastatin 10 mg daily. She has not been taking this because she does not like the way it makes her feel. CHOL elevated at 247 on 05/20/23, up from 230 on 06/13/22. HDL low at 44. LDL elevated at 181, up from 164.   She has seen [facility] for knee pain.Hx torn meniscus right knee.  Considerable problem for several months with right Achilles tendinitis  History of hypertension treated with losartan 25 mg daily. Blood pressure normal today at 120/80.  History of Vitamin D deficiency treated with Drisdol 50,000 units daily.  History of dependent edema treated with furosemide 20 mg daily. She has had swelling in her right lower leg since having right meniscus surgery.  Hysterectomy without oophorectomy 2009. Dr. Rana Snare is GYN physician  Hx cervical spinal stenosis seen by Dr. Franky Macho in 2010  Labs reviewed today. Glucose normal. Kidney, liver functions normal. Electrolytes normal. Blood proteins normal. Hemoglobin low at 11.3, MCV low at 75, MCH low at 22.3, MCHC low at 29.7, RDW elevated at 15.3. TSH at 1.96.  Pap smear last completed 05/25/21.    Mammogram last completed 06/12/22.    Colonoscopy last completed 09/20/20.   Past Medical History:  Diagnosis Date   Allergy    allergic  rhinitis   Anemia    Arthritis    l k nee osteo   Diabetes mellitus without complication (HCC)    diet controlled   Hyperlipidemia    Leg swelling    a. RLE edema, intermittent, 2 negative duplexes in 2015.   Low back pain    Obesity      Family History  Problem Relation Age of Onset   Asthma Other    Hypertension Other    Hyperlipidemia Other    Stroke Other    Heart disease Mother    Hypertension Mother    Stroke Mother    Diabetes Brother      Social History   Social History Narrative   Social history: Does not smoke or consume alcohol.  Resides alone.  Has a college degree.  Has worked at [employer] for over 30 years.  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       Family history: Father deceased with history of asthma.  Mother with history of CVA and hypertension.  1 brother and 2 sisters in good health.      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   Review of Systems  Constitutional:  Negative for chills, fever, malaise/fatigue and weight loss.  HENT:  Negative for hearing loss, sinus pain and sore throat.   Respiratory:  Negative for cough, hemoptysis and shortness of breath.   Cardiovascular:  Negative for chest pain, palpitations, leg swelling and PND.  Gastrointestinal:  Negative for abdominal pain,  constipation, diarrhea, heartburn, nausea and vomiting.  Genitourinary:  Negative for dysuria, frequency and urgency.  Musculoskeletal:  Positive for joint pain (Knees). Negative for back pain, myalgias and neck pain.  Skin:  Negative for itching and rash.  Neurological:  Negative for dizziness, tingling, seizures and headaches.  Endo/Heme/Allergies:  Negative for polydipsia.  Psychiatric/Behavioral:  Negative for depression. The patient is not nervous/anxious.       Objective:   Vitals: BP 120/80   Pulse 64   Ht 5\' 5"  (1.651 m)   Wt 245 lb (111.1 kg)   SpO2 97%   BMI 40.77 kg/m   Physical Exam Vitals and nursing note reviewed.  Constitutional:      General:  She is not in acute distress.    Appearance: Normal appearance. She is not ill-appearing or toxic-appearing.  HENT:     Head: Normocephalic and atraumatic.     Right Ear: Hearing, tympanic membrane, ear canal and external ear normal.     Left Ear: Hearing, tympanic membrane, ear canal and external ear normal.     Mouth/Throat:     Pharynx: Oropharynx is clear.  Eyes:     Extraocular Movements: Extraocular movements intact.     Pupils: Pupils are equal, round, and reactive to light.  Neck:     Thyroid: No thyroid mass, thyromegaly or thyroid tenderness.     Vascular: No carotid bruit.  Cardiovascular:     Rate and Rhythm: Normal rate and regular rhythm. No extrasystoles are present.    Pulses:          Dorsalis pedis pulses are 1+ on the right side and 1+ on the left side.       Posterior tibial pulses are 0 on the right side and 0 on the left side.     Heart sounds: Normal heart sounds. No murmur heard.    No friction rub. No gallop.  Pulmonary:     Effort: Pulmonary effort is normal.     Breath sounds: Normal breath sounds. No decreased breath sounds, wheezing, rhonchi or rales.  Chest:     Chest wall: No mass.  Abdominal:     Palpations: Abdomen is soft. There is no hepatomegaly, splenomegaly or mass.     Tenderness: There is no abdominal tenderness.     Hernia: No hernia is present.  Musculoskeletal:     Cervical back: Normal range of motion.     Right lower leg: 1+ Edema present.     Left lower leg: No edema.  Lymphadenopathy:     Cervical: No cervical adenopathy.     Upper Body:     Right upper body: No supraclavicular adenopathy.     Left upper body: No supraclavicular adenopathy.  Skin:    General: Skin is warm and dry.  Neurological:     General: No focal deficit present.     Mental Status: She is alert and oriented to person, place, and time. Mental status is at baseline.     Sensory: Sensation is intact.     Motor: Motor function is intact. No weakness.      Deep Tendon Reflexes: Reflexes are normal and symmetric.  Psychiatric:        Attention and Perception: Attention normal.        Mood and Affect: Mood normal.        Speech: Speech normal.        Behavior: Behavior normal.        Thought Content: Thought content  normal.        Cognition and Memory: Cognition normal.        Judgment: Judgment normal.      Most recent functional status assessment:    05/23/2023   11:06 AM  In your present state of health, do you have any difficulty performing the following activities:  Hearing? 0  Vision? 0  Difficulty concentrating or making decisions? 0  Walking or climbing stairs? 0  Dressing or bathing? 0  Doing errands, shopping? 0  Preparing Food and eating ? N  Using the Toilet? N  In the past six months, have you accidently leaked urine? N  Do you have problems with loss of bowel control? N  Managing your Medications? N  Managing your Finances? N  Housekeeping or managing your Housekeeping? N   Most recent fall risk assessment:    05/23/2023   11:11 AM  Fall Risk   Falls in the past year? 0  Number falls in past yr: 0  Injury with Fall? 0  Risk for fall due to : No Fall Risks  Follow up Education provided;Falls evaluation completed;Falls prevention discussed    Most recent depression screenings:    05/23/2023   11:11 AM 09/12/2022    4:27 PM  PHQ 2/9 Scores  PHQ - 2 Score 0 0   Most recent cognitive screening:    05/23/2023   11:11 AM  6CIT Screen  What Year? 0 points  What month? 0 points  What time? 0 points  Count back from 20 0 points  Months in reverse 0 points  Repeat phrase 0 points  Total Score 0 points     Results:   Studies obtained and personally reviewed by me:  Pap smear last completed 05/25/21.   Mammogram last completed 06/12/22.    Colonoscopy last completed 09/20/20.   Labs:       Component Value Date/Time   NA 142 05/20/2023 0925   K 4.5 05/20/2023 0925   CL 106 05/20/2023 0925    CO2 27 05/20/2023 0925   GLUCOSE 95 05/20/2023 0925   BUN 18 05/20/2023 0925   CREATININE 0.77 05/20/2023 0925   CALCIUM 9.2 05/20/2023 0925   PROT 7.1 05/20/2023 0925   ALBUMIN 4.2 04/20/2015 0905   AST 16 05/20/2023 0925   ALT 9 05/20/2023 0925   ALKPHOS 82 04/20/2015 0905   BILITOT 0.4 05/20/2023 0925   GFRNONAA 84 06/22/2020 1105   GFRAA 97 06/22/2020 1105     Lab Results  Component Value Date   WBC 5.9 05/20/2023   HGB 11.3 (L) 05/20/2023   HCT 38.0 05/20/2023   MCV 75.0 (L) 05/20/2023   PLT 307 05/20/2023    Lab Results  Component Value Date   CHOL 247 (H) 05/20/2023   HDL 44 (L) 05/20/2023   LDLCALC 181 (H) 05/20/2023   TRIG 102 05/20/2023   CHOLHDL 5.6 (H) 05/20/2023    Lab Results  Component Value Date   HGBA1C 6.0 (H) 05/20/2023     Lab Results  Component Value Date   TSH 1.96 05/20/2023    Assessment & Plan:   Type 2 diabetes mellitus: currently controlled with diet and exercise. HGBA1c at 6.0% on 05/20/23, down from 6.1% on 06/13/22. Referral for endocrinologist.  Hyperlipidemia: treated with rosuvastatin 10 mg daily. She has not been taking this because she does not like the way it feels. CHOL elevated at 247 on 05/20/23, up from 230 on 06/13/22. HDL low at 44. LDL elevated  at 181, up from 164.   Bilateral knee pain: seen by [facility].  BMI 40.77- healthy weight clinic advised  Hypertension: treated with losartan 25 mg daily plus lasix. Blood pressure normal in-office today at 120/80.  Vitamin D deficiency: treated with Drisdol 50,000 units daily.  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  Edema: treated with furosemide 20 mg daily. She has had swelling in her right lower leg since having right meniscus surgery.  EKG today showed sinus bradycardia.  Pap smear last completed 05/25/21.   Mammogram last completed 06/12/22.    Colonoscopy last completed 09/20/20.   Vaccine counseling:  administered pneumococcal 20 vaccine. UTD on tetanus vaccine.  Return in 6 months for follow-up or as needed.     Annual wellness visit done today including the all of the following: Reviewed patient's Family Medical History Reviewed and updated list of patient's medical providers Assessment of cognitive impairment was done Assessed patient's functional ability Established a written schedule for health screening services Health Risk Assessent Completed and Reviewed  Discussed health benefits of physical activity, and encouraged her to engage in regular exercise appropriate for her age and condition.        I,Alexander Ruley,acting as a Neurosurgeon for Margaree Mackintosh, MD.,have documented all relevant documentation on the behalf of Margaree Mackintosh, MD,as directed by  Margaree Mackintosh, MD while in the presence of Margaree Mackintosh, MD.   I, Margaree Mackintosh, MD, have reviewed all documentation for this visit. The documentation on 06/07/23 for the exam, diagnosis, procedures, and orders are all accurate and complete.

## 2023-05-20 ENCOUNTER — Other Ambulatory Visit: Payer: Medicare Other

## 2023-05-20 DIAGNOSIS — E785 Hyperlipidemia, unspecified: Secondary | ICD-10-CM

## 2023-05-20 DIAGNOSIS — E8881 Metabolic syndrome: Secondary | ICD-10-CM

## 2023-05-20 DIAGNOSIS — Z0000 Encounter for general adult medical examination without abnormal findings: Secondary | ICD-10-CM

## 2023-05-20 DIAGNOSIS — E119 Type 2 diabetes mellitus without complications: Secondary | ICD-10-CM

## 2023-05-20 DIAGNOSIS — E7800 Pure hypercholesterolemia, unspecified: Secondary | ICD-10-CM

## 2023-05-20 DIAGNOSIS — D563 Thalassemia minor: Secondary | ICD-10-CM

## 2023-05-21 ENCOUNTER — Other Ambulatory Visit: Payer: Self-pay | Admitting: Podiatry

## 2023-05-21 LAB — HEMOGLOBIN A1C
Hgb A1c MFr Bld: 6 %{Hb} — ABNORMAL HIGH (ref <5.7)
Mean Plasma Glucose: 126 mg/dL
eAG (mmol/L): 7 mmol/L

## 2023-05-21 LAB — CBC WITH DIFFERENTIAL/PLATELET
Absolute Monocytes: 490 {cells}/uL (ref 200–950)
Basophils Absolute: 30 {cells}/uL (ref 0–200)
Basophils Relative: 0.5 %
Eosinophils Absolute: 283 {cells}/uL (ref 15–500)
Eosinophils Relative: 4.8 %
HCT: 38 % (ref 35.0–45.0)
Hemoglobin: 11.3 g/dL — ABNORMAL LOW (ref 11.7–15.5)
Lymphs Abs: 1746 {cells}/uL (ref 850–3900)
MCH: 22.3 pg — ABNORMAL LOW (ref 27.0–33.0)
MCHC: 29.7 g/dL — ABNORMAL LOW (ref 32.0–36.0)
MCV: 75 fL — ABNORMAL LOW (ref 80.0–100.0)
MPV: 10.2 fL (ref 7.5–12.5)
Monocytes Relative: 8.3 %
Neutro Abs: 3351 {cells}/uL (ref 1500–7800)
Neutrophils Relative %: 56.8 %
Platelets: 307 10*3/uL (ref 140–400)
RBC: 5.07 10*6/uL (ref 3.80–5.10)
RDW: 15.3 % — ABNORMAL HIGH (ref 11.0–15.0)
Total Lymphocyte: 29.6 %
WBC: 5.9 10*3/uL (ref 3.8–10.8)

## 2023-05-21 LAB — COMPLETE METABOLIC PANEL WITH GFR
AG Ratio: 1.5 (calc) (ref 1.0–2.5)
ALT: 9 U/L (ref 6–29)
AST: 16 U/L (ref 10–35)
Albumin: 4.3 g/dL (ref 3.6–5.1)
Alkaline phosphatase (APISO): 106 U/L (ref 37–153)
BUN: 18 mg/dL (ref 7–25)
CO2: 27 mmol/L (ref 20–32)
Calcium: 9.2 mg/dL (ref 8.6–10.4)
Chloride: 106 mmol/L (ref 98–110)
Creat: 0.77 mg/dL (ref 0.50–1.05)
Globulin: 2.8 g/dL (ref 1.9–3.7)
Glucose, Bld: 95 mg/dL (ref 65–99)
Potassium: 4.5 mmol/L (ref 3.5–5.3)
Sodium: 142 mmol/L (ref 135–146)
Total Bilirubin: 0.4 mg/dL (ref 0.2–1.2)
Total Protein: 7.1 g/dL (ref 6.1–8.1)
eGFR: 86 mL/min/{1.73_m2} (ref >=60)

## 2023-05-21 LAB — LIPID PANEL
Cholesterol: 247 mg/dL — ABNORMAL HIGH (ref <200)
HDL: 44 mg/dL — ABNORMAL LOW (ref >=50)
LDL Cholesterol (Calc): 181 mg/dL — ABNORMAL HIGH
Non-HDL Cholesterol (Calc): 203 mg/dL — ABNORMAL HIGH (ref <130)
Total CHOL/HDL Ratio: 5.6 (calc) — ABNORMAL HIGH (ref <5.0)
Triglycerides: 102 mg/dL (ref <150)

## 2023-05-21 LAB — MICROALBUMIN / CREATININE URINE RATIO
Creatinine, Urine: 214 mg/dL (ref 20–275)
Microalb Creat Ratio: 9 mg/g{creat} (ref <30)
Microalb, Ur: 2 mg/dL

## 2023-05-21 LAB — TSH: TSH: 1.96 m[IU]/L (ref 0.40–4.50)

## 2023-05-23 ENCOUNTER — Encounter: Payer: Self-pay | Admitting: Internal Medicine

## 2023-05-23 ENCOUNTER — Ambulatory Visit: Payer: Medicare Other | Admitting: Internal Medicine

## 2023-05-23 VITALS — BP 120/80 | HR 64 | Ht 65.0 in | Wt 245.0 lb

## 2023-05-23 DIAGNOSIS — E1169 Type 2 diabetes mellitus with other specified complication: Secondary | ICD-10-CM

## 2023-05-23 DIAGNOSIS — Z6841 Body Mass Index (BMI) 40.0 and over, adult: Secondary | ICD-10-CM

## 2023-05-23 DIAGNOSIS — Z23 Encounter for immunization: Secondary | ICD-10-CM | POA: Diagnosis not present

## 2023-05-23 DIAGNOSIS — D563 Thalassemia minor: Secondary | ICD-10-CM | POA: Diagnosis not present

## 2023-05-23 DIAGNOSIS — R609 Edema, unspecified: Secondary | ICD-10-CM

## 2023-05-23 DIAGNOSIS — I10 Essential (primary) hypertension: Secondary | ICD-10-CM

## 2023-05-23 DIAGNOSIS — Z0000 Encounter for general adult medical examination without abnormal findings: Secondary | ICD-10-CM

## 2023-05-23 DIAGNOSIS — E119 Type 2 diabetes mellitus without complications: Secondary | ICD-10-CM

## 2023-05-23 DIAGNOSIS — E559 Vitamin D deficiency, unspecified: Secondary | ICD-10-CM

## 2023-05-23 DIAGNOSIS — E785 Hyperlipidemia, unspecified: Secondary | ICD-10-CM

## 2023-05-23 LAB — POCT URINALYSIS DIP (CLINITEK)
Bilirubin, UA: NEGATIVE
Blood, UA: NEGATIVE
Glucose, UA: NEGATIVE mg/dL
Ketones, POC UA: NEGATIVE mg/dL
Leukocytes, UA: NEGATIVE
Nitrite, UA: NEGATIVE
POC PROTEIN,UA: NEGATIVE
Spec Grav, UA: 1.015 (ref 1.010–1.025)
Urobilinogen, UA: 0.2 U/dL
pH, UA: 7.5 (ref 5.0–8.0)

## 2023-05-27 ENCOUNTER — Encounter: Payer: Self-pay | Admitting: Obstetrics and Gynecology

## 2023-06-03 ENCOUNTER — Ambulatory Visit: Payer: Medicare Other | Admitting: Internal Medicine

## 2023-06-07 NOTE — Patient Instructions (Signed)
Advise participation in healthy weight clinic. Take weekly high dose Vitamin D supplement. Take furosemide 20 mg daily. Take losartan for blood pressure control. Pneumococcal vaccine given. Follow up in 6 months.

## 2023-07-04 LAB — HM DIABETES EYE EXAM

## 2023-07-07 ENCOUNTER — Encounter: Payer: Self-pay | Admitting: Osteopathic Medicine

## 2023-07-19 ENCOUNTER — Other Ambulatory Visit: Payer: Self-pay | Admitting: Internal Medicine

## 2023-07-22 NOTE — Progress Notes (Unsigned)
No chief complaint on file.  History of Present Illness: 67 yo female with history of anemia, DM, HLD here today as a new consult, referred by Dr. Lenord Fellers, for the evaluation of abnormal EKG. EKG 05/23/23 with sinus bradycardia and non-specific T wave abnormality.   Primary Care Physician: Margaree Mackintosh, MD   Past Medical History:  Diagnosis Date   Allergy    allergic rhinitis   Anemia    Arthritis    l k nee osteo   Diabetes mellitus without complication (HCC)    diet controlled   Hyperlipidemia    Leg swelling    a. RLE edema, intermittent, 2 negative duplexes in 2015.   Low back pain    Obesity     Past Surgical History:  Procedure Laterality Date   ABDOMINAL HYSTERECTOMY     BREAST SURGERY     reduction   LIGAMENT REPAIR Right 05/24/2014   Procedure:  RECONSTRUCTION RADIAL COLLATERAL LIGAMENT METACARPAL PHALANGEAL JOINT RIGHT MIDDLE FINGER WITH PALMARIS LONGUS GRAFT;  Surgeon: Cindee Salt, MD;  Location: [facility];  Service: Orthopedics;  Laterality: Right;   MYOMECTOMY      Current Outpatient Medications  Medication Sig Dispense Refill   aspirin 81 MG EC tablet Take 1 tablet (81 mg total) by mouth daily. Swallow whole.     cetirizine (ZYRTEC ALLERGY) 10 MG tablet Take 10 mg by mouth as needed.     ergocalciferol (DRISDOL) 1.25 MG (50000 UT) capsule Take 1 capsule (50,000 Units total) by mouth once a week. 12 capsule 3   fluticasone (FLONASE) 50 MCG/ACT nasal spray Place 2 sprays into both nostrils daily. 16 g 6   furosemide (LASIX) 20 MG tablet TAKE 1 TABLET BY MOUTH EVERY DAY 90 tablet 1   losartan (COZAAR) 25 MG tablet TAKE 1 TABLET BY MOUTH EVERY DAY 90 tablet 1   meloxicam (MOBIC) 15 MG tablet TAKE 1 TABLET (15 MG TOTAL) BY MOUTH DAILY. 30 tablet 3   Multiple Vitamin (MULTIVITAMIN) tablet Take 1 tablet by mouth daily.     rosuvastatin (CRESTOR) 10 MG tablet TAKE 1 TABLET BY MOUTH EVERY DAY 90 tablet 3   No current facility-administered  medications for this visit.    Allergies  Allergen Reactions   Sulfamethoxazole Hives   Sulfonamide Derivatives     REACTION: hives    Social History   Socioeconomic History   Marital status: Single    Spouse name: Not on file   Number of children: Not on file   Years of education: Not on file   Highest education level: Not on file  Occupational History   Not on file  Tobacco Use   Smoking status: Never   Smokeless tobacco: Never  Substance and Sexual Activity   Alcohol use: Yes    Comment: rare   Drug use: No   Sexual activity: Yes  Other Topics Concern   Not on file  Social History Narrative   Social history: Does not smoke or consume alcohol.  Resides alone.  Has a college degree.  Has worked at [employer] for over 30 years.  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       Family history: Father deceased with history of asthma.  Mother with history of CVA and hypertension.  1 brother and 2 sisters in good health.       Social Determinants of Health   Financial Resource Strain: Low Risk  (05/23/2023)   Overall Financial Resource Strain (CARDIA)  Difficulty of Paying Living Expenses: Not hard at all  Food Insecurity: No Food Insecurity (05/23/2023)   Hunger Vital Sign    Worried About Running Out of Food in the Last Year: Never true    Ran Out of Food in the Last Year: Never true  Transportation Needs: No Transportation Needs (05/23/2023)   PRAPARE - Administrator, Civil Service (Medical): No    Lack of Transportation (Non-Medical): No  Physical Activity: Insufficiently Active (05/23/2023)   Exercise Vital Sign    Days of Exercise per Week: 5 days    Minutes of Exercise per Session: 20 min  Stress: No Stress Concern Present (05/23/2023)   [employer] of Occupational Health - Occupational Stress Questionnaire    Feeling of Stress : Not at all  Social Connections: Moderately Integrated (05/23/2023)   Social Connection and Isolation Panel  [NHANES]    Frequency of Communication with Friends and Family: More than three times a week    Frequency of Social Gatherings with Friends and Family: More than three times a week    Attends Religious Services: More than 4 times per year    Active Member of [employer] or Organizations: Yes    Attends Engineer, structural: More than 4 times per year    Marital Status: Never married  Intimate Partner Violence: Not At Risk (05/23/2023)   Humiliation, Afraid, Rape, and Kick questionnaire    Fear of Current or Ex-Partner: No    Emotionally Abused: No    Physically Abused: No    Sexually Abused: No    Family History  Problem Relation Age of Onset   Asthma Other    Hypertension Other    Hyperlipidemia Other    Stroke Other    Heart disease Mother    Hypertension Mother    Stroke Mother    Diabetes Brother     Review of Systems:  As stated in the HPI and otherwise negative.   There were no vitals taken for this visit.  Physical Examination: General: Well developed, well nourished, NAD  HEENT: OP clear, mucus membranes moist  SKIN: warm, dry. No rashes. Neuro: No focal deficits  Musculoskeletal: Muscle strength 5/5 all ext  Psychiatric: Mood and affect normal  Neck: No JVD, no carotid bruits, no thyromegaly, no lymphadenopathy.  Lungs:Clear bilaterally, no wheezes, rhonci, crackles Cardiovascular: Regular rate and rhythm. No murmurs, gallops or rubs. Abdomen:Soft. Bowel sounds present. Non-tender.  Extremities: No lower extremity edema. Pulses are 2 + in the bilateral DP/PT.  EKG:  EKG {ACTION; IS/IS [id number]} ordered today. The ekg ordered today demonstrates ***  Recent Labs: 05/20/2023: ALT 9; BUN 18; Creat 0.77; Hemoglobin 11.3; Platelets 307; Potassium 4.5; Sodium 142; TSH 1.96   Lipid Panel    Component Value Date/Time   CHOL 247 (H) 05/20/2023 0925   TRIG 102 05/20/2023 0925   HDL 44 (L) 05/20/2023 0925   CHOLHDL 5.6 (H) 05/20/2023 0925   VLDL 17  04/23/2016 1021   LDLCALC 181 (H) 05/20/2023 0925     Wt Readings from Last 3 Encounters:  05/23/23 111.1 kg  09/12/22 114 kg  06/21/22 115.1 kg      Assessment and Plan:   1.   Labs/ tests ordered today include:  No orders of the defined types were placed in this encounter.    Disposition:   F/U with me in ***    Signed, Verne Carrow, MD, [facility] 07/22/2023 9:27 AM    [facility] [address] [place], [place], [place]  [address] Phone: [phone]; Fax: [phone]

## 2023-07-23 ENCOUNTER — Encounter: Payer: Self-pay | Admitting: Cardiovascular Disease

## 2023-07-23 ENCOUNTER — Ambulatory Visit: Payer: Medicare Other | Attending: Cardiovascular Disease | Admitting: Cardiovascular Disease

## 2023-07-23 VITALS — BP 145/90 | HR 67 | Ht 65.0 in | Wt 246.4 lb

## 2023-07-23 DIAGNOSIS — I10 Essential (primary) hypertension: Secondary | ICD-10-CM | POA: Diagnosis not present

## 2023-07-23 DIAGNOSIS — R9431 Abnormal electrocardiogram [ECG] [EKG]: Secondary | ICD-10-CM

## 2023-07-23 NOTE — Patient Instructions (Signed)
Medication Instructions:  No changes *If you need a refill on your cardiac medications before your next appointment, please call your pharmacy*   Lab Work: none   Testing/Procedures: Your physician has requested that you have an echocardiogram. Echocardiography is a painless test that uses sound waves to create images of your heart. It provides your doctor with information about the size and shape of your heart and how well your heart's chambers and valves are working. This procedure takes approximately one hour. There are no restrictions for this procedure. Please do NOT wear cologne, perfume, aftershave, or lotions (deodorant is allowed). Please arrive 15 minutes prior to your appointment time.  Please note: We ask at that you not bring children with you during ultrasound (echo/ vascular) testing. Due to room size and safety concerns, children are not allowed in the ultrasound rooms during exams. Our front office staff cannot provide observation of children in our lobby area while testing is being conducted. An adult accompanying a patient to their appointment will only be allowed in the ultrasound room at the discretion of the ultrasound technician under special circumstances. We apologize for any inconvenience.   Follow-Up: As needed

## 2023-08-09 ENCOUNTER — Other Ambulatory Visit: Payer: Self-pay | Admitting: Internal Medicine

## 2023-08-26 ENCOUNTER — Ambulatory Visit (HOSPITAL_COMMUNITY): Payer: Medicare Other | Attending: Cardiology

## 2023-08-26 DIAGNOSIS — R9431 Abnormal electrocardiogram [ECG] [EKG]: Secondary | ICD-10-CM | POA: Insufficient documentation

## 2023-08-26 LAB — ECHOCARDIOGRAM COMPLETE
AR max vel: 1.38 cm2
AV Area VTI: 1.69 cm2
AV Area mean vel: 1.6 cm2
AV Mean grad: 6 mm[Hg]
AV Peak grad: 15.7 mm[Hg]
Ao pk vel: 1.98 m/s
Area-P 1/2: 2.8 cm2
S' Lateral: 3.2 cm

## 2023-09-01 ENCOUNTER — Telehealth: Payer: Self-pay | Admitting: Cardiovascular Disease

## 2023-09-01 NOTE — Telephone Encounter (Signed)
Patient is requesting call back to discuss recent results. Please advise.

## 2023-09-04 NOTE — Telephone Encounter (Signed)
Reviewed via mychart.  No other concerns.

## 2023-11-17 ENCOUNTER — Other Ambulatory Visit: Payer: Medicare Other

## 2023-11-21 ENCOUNTER — Ambulatory Visit: Payer: Medicare Other | Admitting: Internal Medicine

## 2023-11-25 ENCOUNTER — Other Ambulatory Visit: Payer: Medicare Other

## 2023-11-28 ENCOUNTER — Ambulatory Visit: Payer: Medicare Other | Admitting: Internal Medicine

## 2024-01-01 ENCOUNTER — Other Ambulatory Visit: Payer: Self-pay | Admitting: Podiatry

## 2024-02-05 ENCOUNTER — Other Ambulatory Visit

## 2024-02-05 ENCOUNTER — Other Ambulatory Visit: Payer: Self-pay | Admitting: Internal Medicine

## 2024-02-05 DIAGNOSIS — E1169 Type 2 diabetes mellitus with other specified complication: Secondary | ICD-10-CM

## 2024-02-05 DIAGNOSIS — I10 Essential (primary) hypertension: Secondary | ICD-10-CM

## 2024-02-05 DIAGNOSIS — E119 Type 2 diabetes mellitus without complications: Secondary | ICD-10-CM

## 2024-02-18 ENCOUNTER — Other Ambulatory Visit: Payer: Self-pay | Admitting: Podiatry

## 2024-02-20 ENCOUNTER — Ambulatory Visit: Admitting: Internal Medicine

## 2024-03-08 ENCOUNTER — Other Ambulatory Visit

## 2024-03-08 DIAGNOSIS — E7800 Pure hypercholesterolemia, unspecified: Secondary | ICD-10-CM

## 2024-03-08 DIAGNOSIS — E1169 Type 2 diabetes mellitus with other specified complication: Secondary | ICD-10-CM

## 2024-03-08 DIAGNOSIS — E119 Type 2 diabetes mellitus without complications: Secondary | ICD-10-CM

## 2024-03-09 LAB — HEMOGLOBIN A1C
Hgb A1c MFr Bld: 6 % — ABNORMAL HIGH (ref <5.7)
Mean Plasma Glucose: 126 mg/dL
eAG (mmol/L): 7 mmol/L

## 2024-03-09 LAB — HEPATIC FUNCTION PANEL
AG Ratio: 1.6 (calc) (ref 1.0–2.5)
ALT: 9 U/L (ref 6–29)
AST: 15 U/L (ref 10–35)
Albumin: 4.6 g/dL (ref 3.6–5.1)
Alkaline phosphatase (APISO): 100 U/L (ref 37–153)
Bilirubin, Direct: 0.1 mg/dL (ref 0.0–0.2)
Globulin: 2.9 g/dL (ref 1.9–3.7)
Indirect Bilirubin: 0.4 mg/dL (ref 0.2–1.2)
Total Bilirubin: 0.5 mg/dL (ref 0.2–1.2)
Total Protein: 7.5 g/dL (ref 6.1–8.1)

## 2024-03-09 LAB — MICROALBUMIN / CREATININE URINE RATIO
Creatinine, Urine: 280 mg/dL — ABNORMAL HIGH (ref 20–275)
Microalb Creat Ratio: 5 mg/g{creat} (ref <30)
Microalb, Ur: 1.5 mg/dL

## 2024-03-09 LAB — LIPID PANEL
Cholesterol: 252 mg/dL — ABNORMAL HIGH (ref <200)
HDL: 43 mg/dL — ABNORMAL LOW (ref >=50)
LDL Cholesterol (Calc): 179 mg/dL — ABNORMAL HIGH
Non-HDL Cholesterol (Calc): 209 mg/dL — ABNORMAL HIGH (ref <130)
Total CHOL/HDL Ratio: 5.9 (calc) — ABNORMAL HIGH (ref <5.0)
Triglycerides: 152 mg/dL — ABNORMAL HIGH (ref <150)

## 2024-03-12 ENCOUNTER — Ambulatory Visit: Admitting: Internal Medicine

## 2024-03-12 ENCOUNTER — Encounter: Payer: Self-pay | Admitting: Internal Medicine

## 2024-03-12 ENCOUNTER — Ambulatory Visit: Payer: Self-pay | Admitting: Internal Medicine

## 2024-03-12 ENCOUNTER — Ambulatory Visit
Admission: RE | Admit: 2024-03-12 | Discharge: 2024-03-12 | Disposition: A | Discharge status: Home / Self Care | Source: Ambulatory Visit | Attending: Internal Medicine | Admitting: Internal Medicine

## 2024-03-12 VITALS — BP 140/100 | HR 66 | Temp 98.1°F | Ht 65.0 in | Wt 241.0 lb

## 2024-03-12 DIAGNOSIS — Z01818 Encounter for other preprocedural examination: Secondary | ICD-10-CM | POA: Diagnosis not present

## 2024-03-12 DIAGNOSIS — E119 Type 2 diabetes mellitus without complications: Secondary | ICD-10-CM

## 2024-03-12 DIAGNOSIS — R609 Edema, unspecified: Secondary | ICD-10-CM

## 2024-03-12 DIAGNOSIS — E1169 Type 2 diabetes mellitus with other specified complication: Secondary | ICD-10-CM

## 2024-03-12 DIAGNOSIS — E785 Hyperlipidemia, unspecified: Secondary | ICD-10-CM | POA: Diagnosis not present

## 2024-03-12 DIAGNOSIS — I5189 Other ill-defined heart diseases: Secondary | ICD-10-CM

## 2024-03-12 DIAGNOSIS — M1712 Unilateral primary osteoarthritis, left knee: Secondary | ICD-10-CM

## 2024-03-12 DIAGNOSIS — I10 Essential (primary) hypertension: Secondary | ICD-10-CM

## 2024-03-12 DIAGNOSIS — Z6841 Body Mass Index (BMI) 40.0 and over, adult: Secondary | ICD-10-CM

## 2024-03-12 DIAGNOSIS — D563 Thalassemia minor: Secondary | ICD-10-CM

## 2024-03-12 LAB — POCT URINALYSIS DIP (CLINITEK)
Bilirubin, UA: NEGATIVE
Blood, UA: NEGATIVE
Glucose, UA: NEGATIVE mg/dL
Ketones, POC UA: NEGATIVE mg/dL
Leukocytes, UA: NEGATIVE
Nitrite, UA: NEGATIVE
POC PROTEIN,UA: NEGATIVE
Spec Grav, UA: 1.01 (ref 1.010–1.025)
Urobilinogen, UA: 0.2 U/dL
pH, UA: 7.5 (ref 5.0–8.0)

## 2024-03-12 NOTE — Progress Notes (Incomplete)
 Patient Care Team: Perri Ronal PARAS, MD as PCP - General (Internal Medicine)  Visit Date: 03/12/24  Subjective:   Chief Complaint  Patient presents with   Pre-op Exam   Hyperlipidemia   Hypertension   Diabetes   Vitals:   03/12/24 0913 03/12/24 0937  BP: (!) 160/100 (!) 140/100   Patient PI:Inmnuyb L Champoux,Female DOB:1956-03-13,68 y.o. FMW:[id number]   68 y.o.Female presents today for Pre-op Exam. Patient has a past medical history of HLD, HTN, DMII.  Scheduled for surgery 04/21/2024 Knee Replacement  Expected to be outpatient  History of Hypertension treated with ***. Blood Pressure: elevated today at 160/100, 25 minutes later 140/100.   History of Hyperlipidemia treated with *** . ***/***/*** Lipid Panel ***. *** Coronary Calcium  Score:  Lab Results  Component Value Date   CHOL 252 (H) 03/08/2024   HDL 43 (L) 03/08/2024   LDLCALC 179 (H) 03/08/2024   TRIG 152 (H) 03/08/2024    History of {IGTvsDM:32677} treated with ***.  03/08/2024 HgbA1c 6.0, Glucose ***, and Albumin ***. UTD on eye exam with [facility]  Lab Results  Component Value Date   GLUCOSE 95 05/20/2023   HGBA1C 6.0 (H) 03/08/2024  Tried   Tzhncb  Mammogram with Dr. Marget  Vaccine Counseling Declines Covid-19 and Shingles 1/2; agreeable to updating Influenza.  Cbc Cmet A1c Urine - albumin  Pt Cxr/EKG  Past Medical History:  Diagnosis Date   Allergy    allergic rhinitis   Anemia    Arthritis    l k nee osteo   Diabetes mellitus without complication (HCC)    diet controlled   Hyperlipidemia    Leg swelling    a. RLE edema, intermittent, 2 negative duplexes in 2015.   Low back pain    Obesity     Allergies  Allergen Reactions   Sulfamethoxazole Hives   Sulfonamide Derivatives     REACTION: hives    Family History  Problem Relation Age of Onset   Hypertension Mother    Stroke Mother    COPD Father    Diabetes Brother    Asthma Other    Hypertension Other     Hyperlipidemia Other    Stroke Other    Social History   Social History Narrative   Social history: Does not smoke or consume alcohol.  Resides alone.  Has a college degree.  Has worked at [employer] for over 30 years.  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       Family history: Father deceased with history of asthma.  Mother with history of CVA and hypertension.  1 brother and 2 sisters in good health.       ROS   Objective:  Vitals: BP (!) 140/100   Pulse 66   Temp 98.1 F (36.7 C)   Ht 5' 5 (1.651 m)   Wt 241 lb (109.3 kg)   SpO2 96%   BMI 40.10 kg/m   Physical Exam Vitals and nursing note reviewed.  Constitutional:      General: She is not in acute distress.    Appearance: Normal appearance. She is not toxic-appearing.  HENT:     Head: Normocephalic and atraumatic.  Neck:     Thyroid : No thyroid  mass or thyromegaly.  Cardiovascular:     Rate and Rhythm: Normal rate and regular rhythm. No extrasystoles are present.    Pulses: Normal pulses.     Heart sounds: Normal heart sounds. No murmur heard.    No  friction rub. No gallop.  Pulmonary:     Effort: Pulmonary effort is normal. No respiratory distress.     Breath sounds: Normal breath sounds. No wheezing or rales.  Musculoskeletal:     Right lower leg: Edema (trace) present.     Left lower leg: No edema.  Skin:    General: Skin is warm and dry.  Neurological:     Mental Status: She is alert and oriented to person, place, and time. Mental status is at baseline.  Psychiatric:        Mood and Affect: Mood normal.        Behavior: Behavior normal.        Thought Content: Thought content normal.        Judgment: Judgment normal.     Results:  Studies Obtained And Personally Reviewed By Me:  03/12/2024 EKG reviewed and is normal; when compared with 05/2023 EKG, no significant change.   Mammogram    Labs:     Component Value Date/Time   NA 142 05/20/2023 0925   K 4.5 05/20/2023 0925   CL 106 05/20/2023  0925   CO2 27 05/20/2023 0925   GLUCOSE 95 05/20/2023 0925   BUN 18 05/20/2023 0925   CREATININE 0.77 05/20/2023 0925   CALCIUM  9.2 05/20/2023 0925   PROT 7.5 03/08/2024 0924   ALBUMIN 4.2 04/20/2015 0905   AST 15 03/08/2024 0924   ALT 9 03/08/2024 0924   ALKPHOS 82 04/20/2015 0905   BILITOT 0.5 03/08/2024 0924   GFRNONAA 84 06/22/2020 1105   GFRAA 97 06/22/2020 1105    Lab Results  Component Value Date   WBC 5.9 05/20/2023   HGB 11.3 (L) 05/20/2023   HCT 38.0 05/20/2023   MCV 75.0 (L) 05/20/2023   PLT 307 05/20/2023   Lab Results  Component Value Date   CHOL 252 (H) 03/08/2024   HDL 43 (L) 03/08/2024   LDLCALC 179 (H) 03/08/2024   TRIG 152 (H) 03/08/2024   CHOLHDL 5.9 (H) 03/08/2024   Lab Results  Component Value Date   HGBA1C 6.0 (H) 03/08/2024    Lab Results  Component Value Date   TSH 1.96 05/20/2023    Assessment & Plan:   ***        I,Emily Lagle,acting as a scribe for Ronal JINNY Hailstone, MD.,have documented all relevant documentation on the behalf of Ronal JINNY Hailstone, MD,as directed by  Ronal JINNY Hailstone, MD while in the presence of Ronal JINNY Hailstone, MD.   ***

## 2024-03-12 NOTE — Progress Notes (Addendum)
 Patient Care Team: Perri Ronal PARAS, MD as PCP - General (Internal Medicine)  Visit Date: 03/12/24  Subjective:   Chief Complaint  Patient presents with   Pre-op Exam   Hyperlipidemia   Hypertension   Diabetes   Vitals:   03/12/24 0913 03/12/24 0937  BP: (!) 160/100 (!) 140/100   Patient Barbara [name],Female DOB:10-Jan-1956,68 y.o. FMW:[id number]   68 y.o.Female presents today for Pre-op Exam. Patient has a past medical history of HLD, HTN, DMII.   Seen for her annual visit 05/23/2023, in the interim has been seen by Cardiologist, Dr. Verlin, and [facility].  Scheduled for surgery 04/21/2024 Knee Replacement, and she says that this is expected to be an outpatient procedure.   History of Hypertension treated with Losartan  25 mg daily and Lasix  20 mg daily. Blood Pressure: elevated today at 160/100, 25 minutes later 140/100.   History of Hyperlipidemia treated with Rosuvastatin  10 mg daily . 03/08/2024 Lipid Panel, compared to 05/2023: Cholesterol 252, elevated from 247; HDL 43, decreased from 44; LDL 179, decreased from 181; and Triglycerides 152, elevated from 102.   History of Impaired Glucose Tolerance with 03/08/2024 HgbA1c 6.0, no change from 05/2023; Albumin WNL 1.5. UTD on eye exam with [facility] 07/04/2023 - no diabetic retinopathy. Says that her Gynecologist, Dr. Marget tried to prescribe [phone], but could not get it approved. Since December 2024, she has lost 5 lbs and now weighs 241 lbs.  Also had her Mammogram with Dr. Marget apparently, which can not be seen in [facility], [facility], or Care Everywhere so will request records.   Vaccine Counseling: declines Covid-19 and Shingles 1/2; agreeable to updating Influenza. Past Medical History:  Diagnosis Date   Allergy    allergic rhinitis   Anemia    Arthritis    l k nee osteo   Diabetes mellitus without complication (HCC)    diet controlled   Hyperlipidemia    Leg swelling    a. RLE edema, intermittent, 2  negative duplexes in 2015.   Low back pain    Obesity     Allergies  Allergen Reactions   Sulfamethoxazole Hives   Sulfonamide Derivatives     REACTION: hives    Family History  Problem Relation Age of Onset   Hypertension Mother    Stroke Mother    COPD Father    Diabetes Brother    Asthma Other    Hypertension Other    Hyperlipidemia Other    Stroke Other    Social History   Social History Narrative   Social history: Does not smoke or consume alcohol.  Resides alone.  Has a college degree.  Has worked at [employer] for over 30 years.  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       Family history: Father deceased with history of asthma.  Mother with history of CVA and hypertension.  1 brother and 2 sisters in good health.       Review of Systems  Constitutional:  Negative for fever and malaise/fatigue.  HENT:  Negative for congestion.   Eyes:  Negative for blurred vision.  Respiratory:  Negative for cough and shortness of breath.   Cardiovascular:  Negative for chest pain, palpitations and leg swelling.  Gastrointestinal:  Negative for vomiting.  Musculoskeletal:  Negative for back pain.  Skin:  Negative for rash.  Neurological:  Negative for loss of consciousness and headaches.     Objective:  Vitals: BP (!) 140/100   Pulse 66  Temp 98.1 F (36.7 C)   Ht 5' 5 (1.651 m)   Wt 241 lb (109.3 kg)   SpO2 96%   BMI 40.10 kg/m   Physical Exam Vitals and nursing note reviewed.  Constitutional:      General: She is not in acute distress.    Appearance: Normal appearance. She is not toxic-appearing.  HENT:     Head: Normocephalic and atraumatic.  Neck:     Thyroid : No thyroid  mass or thyromegaly.  Cardiovascular:     Rate and Rhythm: Normal rate and regular rhythm. No extrasystoles are present.    Pulses: Normal pulses.     Heart sounds: Normal heart sounds. No murmur heard.    No friction rub. No gallop.  Pulmonary:     Effort: Pulmonary effort is normal. No  respiratory distress.     Breath sounds: Normal breath sounds. No wheezing or rales.  Musculoskeletal:     Right lower leg: Edema (trace) present.     Left lower leg: No edema.  Skin:    General: Skin is warm and dry.  Neurological:     Mental Status: She is alert and oriented to person, place, and time. Mental status is at baseline.  Psychiatric:        Mood and Affect: Mood normal.        Behavior: Behavior normal.        Thought Content: Thought content normal.        Judgment: Judgment normal.     Results:  Studies Obtained And Personally Reviewed By Me:  03/12/2024 EKG reviewed and is normal; when compared with 05/2023 EKG, no significant change.   Addendum:  2 VIEW(S) XRAY OF THE CHEST 03/12/2024 10:27:51 AM  COMPARISON: None available.   CLINICAL HISTORY: Pre-op CXR. Hx of diastolic dysfunction. Morbid obesity.   FINDINGS: LUNGS AND PLEURA: No focal pulmonary opacity. No pulmonary edema. No pleural effusion. No pneumothorax.   HEART AND MEDIASTINUM: No acute abnormality of the cardiac and mediastinal silhouettes.   BONES AND SOFT TISSUES: Thoracic spine dextrocurvature. Thoracic spondylosis. Left shoulder degenerative change. No acute osseous abnormality.   IMPRESSION: 1. No acute findings.   Labs:     Component Value Date/Time   NA 142 05/20/2023 0925   K 4.5 05/20/2023 0925   CL 106 05/20/2023 0925   CO2 27 05/20/2023 0925   GLUCOSE 95 05/20/2023 0925   BUN 18 05/20/2023 0925   CREATININE 0.77 05/20/2023 0925   CALCIUM  9.2 05/20/2023 0925   PROT 7.5 03/08/2024 0924   ALBUMIN 4.2 04/20/2015 0905   AST 15 03/08/2024 0924   ALT 9 03/08/2024 0924   ALKPHOS 82 04/20/2015 0905   BILITOT 0.5 03/08/2024 0924   GFRNONAA 84 06/22/2020 1105   GFRAA 97 06/22/2020 1105    Lab Results  Component Value Date   WBC 5.9 05/20/2023   HGB 11.3 (L) 05/20/2023   HCT 38.0 05/20/2023   MCV 75.0 (L) 05/20/2023   PLT 307 05/20/2023   Lab Results  Component Value  Date   CHOL 252 (H) 03/08/2024   HDL 43 (L) 03/08/2024   LDLCALC 179 (H) 03/08/2024   TRIG 152 (H) 03/08/2024   CHOLHDL 5.9 (H) 03/08/2024   Lab Results  Component Value Date   HGBA1C 6.0 (H) 03/08/2024    Lab Results  Component Value Date   TSH 1.96 05/20/2023    Assessment & Plan:   Orders Placed This Encounter  Procedures   DG Chest 2 View  Reason for Exam (SYMPTOM  OR DIAGNOSIS REQUIRED):   pre-op CXR. Hx of disastolic dysfunction. Morbid obesity.    Preferred imaging location?:   [facility]   CBC with Differential/Platelet   Protime-INR   Basic Metabolic Panel   POCT URINALYSIS DIP (CLINITEK)   EKG 12-Lead  Pre-op Examination: scheduled for surgery 04/21/2024 Knee Replacement, and she says that this is expected to be an outpatient procedure.  EKG done today normal on review and comparison with 05/2023 EKG. CXR ordered and normal on review. Ordering CBC, Protime-INR, and B-MET.   Hypertension treated with Losartan  25 mg daily and Lasix  20 mg daily. Blood Pressure: elevated today at 160/100, 25 minutes later 140/100.   Hyperlipidemia treated with Rosuvastatin  10 mg daily . 03/08/2024 Lipid Panel, compared to 05/2023: Cholesterol 252, elevated from 247; HDL 43, decreased from 44; LDL 179, decreased from 181; and Triglycerides 152, elevated from 102.   Type 2 diabetes: well controlled   with 03/08/2024 HgbA1c 6.0, no change from 05/2023; Albumin WNL 1.5. UTD on eye exam with [facility] 07/04/2023 - no diabetic retinopathy. Says that her Gynecologist, Dr. Marget tried to prescribe [phone], but could not get it approved. Since December 2024, she has lost 5 lbs and now weighs 241 lbs.  Also had her Mammogram with Dr. Marget apparently, which can not be seen in [facility], [facility], or Care Everywhere.  Will request records.   Vaccine Counseling: declines Covid-19 and Shingles 1/2; agreeable to updating Influenza.    I,Emily Lagle,acting as a Neurosurgeon for Ronal JINNY Hailstone,  MD.,have documented all relevant documentation on the behalf of Ronal JINNY Hailstone, MD,as directed by  Ronal JINNY Hailstone, MD while in the presence of Ronal JINNY Hailstone, MD.    I, Ronal JINNY Hailstone, MD, have reviewed all documentation for this visit. The documentation on 03/12/2024 for the exam, diagnosis, procedures, and orders are all accurate and complete.

## 2024-03-14 LAB — BASIC METABOLIC PANEL WITH GFR
BUN: 13 mg/dL (ref 7–25)
CO2: 28 mmol/L (ref 20–32)
Calcium: 9.2 mg/dL (ref 8.6–10.4)
Chloride: 102 mmol/L (ref 98–110)
Creat: 0.71 mg/dL (ref 0.50–1.05)
Glucose, Bld: 89 mg/dL (ref 65–99)
Potassium: 4.7 mmol/L (ref 3.5–5.3)
Sodium: 138 mmol/L (ref 135–146)
eGFR: 94 mL/min/1.73m2 (ref >=60)

## 2024-03-14 LAB — CBC WITH DIFFERENTIAL/PLATELET
Absolute Lymphocytes: 1945 {cells}/uL (ref 850–3900)
Absolute Monocytes: 585 {cells}/uL (ref 200–950)
Basophils Absolute: 20 {cells}/uL (ref 0–200)
Basophils Relative: 0.3 %
Eosinophils Absolute: 340 {cells}/uL (ref 15–500)
Eosinophils Relative: 5 %
HCT: 38.4 % (ref 35.0–45.0)
Hemoglobin: 11.5 g/dL — ABNORMAL LOW (ref 11.7–15.5)
MCH: 22.7 pg — ABNORMAL LOW (ref 27.0–33.0)
MCHC: 29.9 g/dL — ABNORMAL LOW (ref 32.0–36.0)
MCV: 75.9 fL — ABNORMAL LOW (ref 80.0–100.0)
MPV: 10.5 fL (ref 7.5–12.5)
Monocytes Relative: 8.6 %
Neutro Abs: 3910 {cells}/uL (ref 1500–7800)
Neutrophils Relative %: 57.5 %
Platelets: 347 Thousand/uL (ref 140–400)
RBC: 5.06 Million/uL (ref 3.80–5.10)
RDW: 14.9 % (ref 11.0–15.0)
Total Lymphocyte: 28.6 %
WBC: 6.8 Thousand/uL (ref 3.8–10.8)

## 2024-03-14 LAB — PROTIME-INR
INR: 1
Prothrombin Time: 10.7 s (ref 9.0–11.5)

## 2024-03-18 NOTE — Telephone Encounter (Signed)
 Copied from CRM [id number]. Topic: Clinical - Medication Refill >> Mar 18, 2024 10:55 AM Harlene ORN wrote: Medication: furosemide  (LASIX ) 20 MG tablet, losartan  (COZAAR ) 25 MG tablet  Has the patient contacted their pharmacy? Yes (Agent: If no, request that the patient contact the pharmacy for the refill. If patient does not wish to contact the pharmacy document the reason why and proceed with request.) (Agent: If yes, when and what did the pharmacy advise?)  This is the patient's preferred pharmacy:  [phone] [place] - [address] [place] [address] Phone: [phone] Fax: [phone]  Is this the correct pharmacy for this prescription? Yes If no, delete pharmacy and type the correct one.   Has the prescription been filled recently? No  Is the patient out of the medication? Yes  Has the patient been seen for an appointment in the last year OR does the patient have an upcoming appointment? Yes  Can we respond through MyChart? Yes  Agent: Please be advised that Rx refills may take up to 3 business days. We ask that you follow-up with your pharmacy.

## 2024-03-19 ENCOUNTER — Other Ambulatory Visit: Payer: Self-pay

## 2024-03-19 DIAGNOSIS — I10 Essential (primary) hypertension: Secondary | ICD-10-CM

## 2024-03-19 MED ORDER — LOSARTAN POTASSIUM 25 MG PO TABS: 25.0000 mg | ORAL_TABLET | Freq: Every day | ORAL | 1 refills | Status: AC

## 2024-03-19 MED ORDER — FUROSEMIDE 20 MG PO TABS: 20.0000 mg | ORAL_TABLET | Freq: Every day | ORAL | 1 refills | Status: AC

## 2024-04-16 ENCOUNTER — Other Ambulatory Visit: Payer: Self-pay | Admitting: Internal Medicine

## 2024-04-16 MED ORDER — ROSUVASTATIN CALCIUM 10 MG PO TABS: 10.0000 mg | ORAL_TABLET | Freq: Every day | ORAL | 3 refills | Status: AC

## 2024-05-27 ENCOUNTER — Ambulatory Visit: Admitting: Internal Medicine

## 2024-05-29 ENCOUNTER — Ambulatory Visit
Admission: RE | Admit: 2024-05-29 | Discharge: 2024-05-29 | Disposition: A | Discharge status: Home / Self Care | Source: Ambulatory Visit | Attending: Family Medicine | Admitting: Family Medicine

## 2024-05-29 VITALS — BP 147/93 | HR 76 | Temp 98.3°F | Resp 20

## 2024-05-29 DIAGNOSIS — J329 Chronic sinusitis, unspecified: Secondary | ICD-10-CM

## 2024-05-29 DIAGNOSIS — J40 Bronchitis, not specified as acute or chronic: Secondary | ICD-10-CM

## 2024-05-29 MED ORDER — PROMETHAZINE-DM 6.25-15 MG/5ML PO SYRP: 5.0000 mL | ORAL_SOLUTION | Freq: Three times a day (TID) | ORAL | 0 refills | Status: AC | PRN

## 2024-05-29 MED ORDER — AMOXICILLIN-POT CLAVULANATE 875-125 MG PO TABS: 1.0000 | ORAL_TABLET | Freq: Two times a day (BID) | ORAL | 0 refills | Status: DC

## 2024-05-29 NOTE — ED Triage Notes (Addendum)
 Patient reports having a cough, congestion and headache that began about a week ago. She reports that the congestion is not getting any better with the use of interventions.   Home interventions: mucinex

## 2024-05-29 NOTE — Discharge Instructions (Addendum)
 Start Augmentin 10 twice daily for 7 days.  You may take Promethazine DM as needed for your cough.  Please note this medication will make you drowsy.  Do not drink alcohol or drive on this medication.  Lots of rest and fluids.  Please follow-up with your PCP in 2 to 3 days for recheck.  Please go to the ER if you develop any worsening symptoms.  I hope you feel better soon!

## 2024-05-29 NOTE — ED Provider Notes (Signed)
 [facility]    CSN: [id number] Arrival date & time: 05/29/24  1055      History   Chief Complaint Chief Complaint  Patient presents with   Cough    I am coughing up mucus. I'm congested and the mucus is like a greenish color and it's been going on for like three or four days and it's still getting better. I have a headache like sinus headache. I got a runny nose and I'm coughing a lot. - Entered by patient   Nasal Congestion   Headache    HPI Barbara [name] is a 68 y.o. female  presents for evaluation of URI symptoms for 7 days. Patient reports associated symptoms of cough, congestion, sinus pressure/pain with headache. Denies N/V/D, fevers, sore throat, ear pain, body aches, shortness of breath. Patient does not have a hx of asthma. Patient is not an active smoker.    Pt has taken Mucinex OTC for symptoms. Pt has no other concerns at this time.    Cough Associated symptoms: headaches   Headache Associated symptoms: congestion, cough and sinus pressure     Past Medical History:  Diagnosis Date   Allergy    allergic rhinitis   Anemia    Arthritis    l k nee osteo   Diabetes mellitus without complication (HCC)    diet controlled   Hyperlipidemia    Leg swelling    a. RLE edema, intermittent, 2 negative duplexes in 2015.   Low back pain    Obesity     Patient Active Problem List   Diagnosis Date Noted   Allergic rhinitis due to pollen 03/06/2022   Disorder of function of stomach 03/06/2022   Tinnitus of left ear 07/20/2020   Hyperlipidemia associated with type 2 diabetes mellitus (HCC) 05/06/2020   Anemia 04/07/2020   Arthritis 04/07/2020   S/P right knee arthroscopy 06/05/2017   Impaired glucose tolerance 05/19/2017   Chronic pain of right knee 10/22/2016   Anxiety state 07/11/2015   Essential hypertension 03/27/2015   Primary osteoarthritis of left knee 01/18/2014   Hyperlipidemia 04/13/2012   Obesity 04/13/2012   Metabolic syndrome  04/13/2012   Urge urinary incontinence 07/17/2011   Allergic rhinitis 02/07/2009    Past Surgical History:  Procedure Laterality Date   ABDOMINAL HYSTERECTOMY     BREAST SURGERY     reduction   LIGAMENT REPAIR Right 05/24/2014   Procedure:  RECONSTRUCTION RADIAL COLLATERAL LIGAMENT METACARPAL PHALANGEAL JOINT RIGHT MIDDLE FINGER WITH PALMARIS LONGUS GRAFT;  Surgeon: Arley Curia, MD;  Location: [facility];  Service: Orthopedics;  Laterality: Right;    OB History   No obstetric history on file.      Home Medications    Prior to Admission medications   Medication Sig Start Date End Date Taking? Authorizing Provider  amoxicillin-clavulanate (AUGMENTIN) 875-125 MG tablet Take 1 tablet by mouth every 12 (twelve) hours. 05/29/24  Yes Dequante Tremaine, Jodi R, NP  promethazine-dextromethorphan (PROMETHAZINE-DM) 6.25-15 MG/5ML syrup Take 5 mLs by mouth 3 (three) times daily as needed for cough. 05/29/24  Yes Loreda Myla SAUNDERS, NP  aspirin  81 MG EC tablet Take 1 tablet (81 mg total) by mouth daily. Swallow whole. 01/13/15   Dunn, Dayna N, PA-C  fluticasone  (FLONASE ) 50 MCG/ACT nasal spray Place 2 sprays into both nostrils daily. 08/14/22   Perri Ronal PARAS, MD  furosemide  (LASIX ) 20 MG tablet Take 1 tablet (20 mg total) by mouth daily. 03/19/24   Perri Ronal PARAS, MD  losartan  (COZAAR ) 25 MG tablet Take 1 tablet (25 mg total) by mouth daily. 03/19/24   Perri Ronal PARAS, MD  meloxicam  (MOBIC ) 15 MG tablet TAKE 1 TABLET (15 MG TOTAL) BY MOUTH DAILY. 05/23/23   McDonald, Juliene SAUNDERS, DPM  Multiple Vitamin (MULTIVITAMIN) tablet Take 1 tablet by mouth daily.    [provider]  rosuvastatin  (CRESTOR ) 10 MG tablet Take 1 tablet (10 mg total) by mouth daily. 04/16/24   Perri Ronal PARAS, MD    Family History Family History  Problem Relation Age of Onset   Hypertension Mother    Stroke Mother    COPD Father    Diabetes Brother    Asthma Other    Hypertension Other    Hyperlipidemia Other    Stroke Other      Social History Social History   Tobacco Use   Smoking status: Never   Smokeless tobacco: Never  Substance Use Topics   Alcohol use: Yes    Comment: rare   Drug use: No     Allergies   Sulfamethoxazole and Sulfonamide derivatives   Review of Systems Review of Systems  HENT:  Positive for congestion, sinus pressure and sinus pain.   Respiratory:  Positive for cough.   Neurological:  Positive for headaches.     Physical Exam Triage Vital Signs ED Triage Vitals  Encounter Vitals Group     BP 05/29/24 1125 (!) 147/93     Girls Systolic BP Percentile --      Girls Diastolic BP Percentile --      Boys Systolic BP Percentile --      Boys Diastolic BP Percentile --      Pulse Rate 05/29/24 1125 76     Resp 05/29/24 1125 20     Temp 05/29/24 1125 98.3 F (36.8 C)     Temp Source 05/29/24 1125 Oral     SpO2 05/29/24 1125 96 %     Weight --      Height --      Head Circumference --      Peak Flow --      Pain Score 05/29/24 1122 9     Pain Loc --      Pain Education --      Exclude from Growth Chart --    No data found.  Updated Vital Signs BP (!) 147/93 (BP Location: Left Arm)   Pulse 76   Temp 98.3 F (36.8 C) (Oral)   Resp 20   SpO2 96%   Visual Acuity Right Eye Distance:   Left Eye Distance:   Bilateral Distance:    Right Eye Near:   Left Eye Near:    Bilateral Near:     Physical Exam Vitals and nursing note reviewed.  Constitutional:      General: Barbara [name] is not in acute distress.    Appearance: Barbara [name] is well-developed. Barbara [name] is not ill-appearing.  HENT:     Head: Normocephalic and atraumatic.     Right Ear: Tympanic membrane and ear canal normal.     Left Ear: Tympanic membrane and ear canal normal.     Nose: Congestion present.     Right Turbinates: Pale.     Left Turbinates: Pale.     Right Sinus: No maxillary sinus tenderness or frontal sinus tenderness.     Left Sinus: No maxillary sinus tenderness or frontal sinus tenderness.      Mouth/Throat:     Mouth: Mucous membranes are moist.  Pharynx: Oropharynx is clear. Uvula midline. No posterior oropharyngeal erythema.     Tonsils: No tonsillar exudate or tonsillar abscesses.  Eyes:     Conjunctiva/sclera: Conjunctivae normal.     Pupils: Pupils are equal, round, and reactive to light.  Cardiovascular:     Rate and Rhythm: Normal rate and regular rhythm.     Heart sounds: Normal heart sounds.  Pulmonary:     Effort: Pulmonary effort is normal.     Breath sounds: Normal breath sounds. No wheezing or rhonchi.  Musculoskeletal:     Cervical back: Normal range of motion and neck supple.  Lymphadenopathy:     Cervical: No cervical adenopathy.  Skin:    General: Skin is warm and dry.  Neurological:     General: No focal deficit present.     Mental Status: Barbara [name] is alert and oriented to person, place, and time.  Psychiatric:        Mood and Affect: Mood normal.        Behavior: Behavior normal.      UC Treatments / Results  Labs (all labs ordered are listed, but only abnormal results are displayed) Labs Reviewed - No data to display Basic Metabolic Panel Order: [id number]  Status: Final result     Next appt: 05/31/2024 at 09:00 AM in Family Medicine ([facility])     Dx: Pre-op exam   Test Result Released: Yes (not seen)   0 Result Notes     1 HM Topic          Component Ref Range & Units (hover) 2 mo ago (03/12/24) 1 yr ago (05/20/23) 2 yr ago (07/19/21) 3 yr ago (06/22/20) 4 yr ago (06/18/19) 6 yr ago (04/24/18) 6 yr ago (12/05/17)  Glucose, Bld 89 95 CM 87 CM 85 CM 90 CM 91 CM 91 CM  Comment: .            Fasting reference interval .  BUN 13 18 15 15 15 24 22   Creat 0.71 0.77 0.68 0.76 R, CM 0.80 R, CM 0.86 R, CM 0.72 R, CM  eGFR 94 86 98 CM      BUN/Creatinine Ratio SEE NOTE: SEE NOTE: CM NOT APPLICABLE NOT APPLICABLE NOT APPLICABLE NOT APPLICABLE NOT APPLICABLE  Comment:    Not Reported: BUN and Creatinine are within    reference range. .  Sodium  138 142 142 142 141 139 143  Potassium 4.7 4.5 4.5 4.4 4.8 4.3 4.7  Chloride 102 106 106 107 105 103 108  CO2 28 27 28 26 26 26 26   Calcium  9.2 9.2 9.0 9.0 9.3 9.6 9.3  Resulting Agency [facility] [place] [facility] [place] [facility] [place] [facility]        EKG   Radiology No results found.  Procedures Procedures (including critical care time)  Medications Ordered in UC Medications - No data to display  Initial Impression / Assessment and Plan / UC Course  I have reviewed the triage vital signs and the nursing notes.  Pertinent labs & imaging results that were available during my care of the patient were reviewed by me and considered in my medical decision making (see chart for details).     Reviewed exam and symptoms with patient.  No red flags.  Will start Augmentin twice daily for 7 days.  Promethazine DM as needed for cough, side effect profile reviewed.  Encouraged rest fluids and PCP follow-up if symptoms do not improve.  ER precautions reviewed. Final Clinical  Impressions(s) / UC Diagnoses   Final diagnoses:  Sinobronchitis     Discharge Instructions      Start Augmentin 10 twice daily for 7 days.  You may take Promethazine DM as needed for your cough.  Please note this medication will make you drowsy.  Do not drink alcohol or drive on this medication.  Lots of rest and fluids.  Please follow-up with your PCP in 2 to 3 days for recheck.  Please go to the ER if you develop any worsening symptoms.  I hope you feel better soon!    ED Prescriptions     Medication Sig Dispense Auth. Provider   amoxicillin-clavulanate (AUGMENTIN) 875-125 MG tablet Take 1 tablet by mouth every 12 (twelve) hours. 14 tablet Phat Dalton, Jodi R, NP   promethazine-dextromethorphan (PROMETHAZINE-DM) 6.25-15 MG/5ML syrup Take 5 mLs by mouth 3 (three) times daily as needed for cough. 118 mL Greenley Martone, Jodi R, NP      PDMP not reviewed this encounter.   Loreda Myla SAUNDERS,  NP 05/29/24 [phone]

## 2024-05-31 ENCOUNTER — Other Ambulatory Visit

## 2024-05-31 DIAGNOSIS — E7800 Pure hypercholesterolemia, unspecified: Secondary | ICD-10-CM

## 2024-05-31 DIAGNOSIS — E1169 Type 2 diabetes mellitus with other specified complication: Secondary | ICD-10-CM

## 2024-05-31 DIAGNOSIS — E559 Vitamin D deficiency, unspecified: Secondary | ICD-10-CM

## 2024-05-31 DIAGNOSIS — Z6841 Body Mass Index (BMI) 40.0 and over, adult: Secondary | ICD-10-CM

## 2024-05-31 DIAGNOSIS — I10 Essential (primary) hypertension: Secondary | ICD-10-CM

## 2024-05-31 DIAGNOSIS — M1712 Unilateral primary osteoarthritis, left knee: Secondary | ICD-10-CM

## 2024-05-31 DIAGNOSIS — E119 Type 2 diabetes mellitus without complications: Secondary | ICD-10-CM

## 2024-06-01 LAB — LIPID PANEL
Cholesterol: 177 mg/dL (ref <200)
HDL: 45 mg/dL — ABNORMAL LOW (ref >=50)
LDL Cholesterol (Calc): 113 mg/dL — ABNORMAL HIGH
Non-HDL Cholesterol (Calc): 132 mg/dL — ABNORMAL HIGH (ref <130)
Total CHOL/HDL Ratio: 3.9 (calc) (ref <5.0)
Triglycerides: 86 mg/dL (ref <150)

## 2024-06-01 LAB — MICROALBUMIN / CREATININE URINE RATIO
Creatinine, Urine: 206 mg/dL (ref 20–275)
Microalb Creat Ratio: 8 mg/g{creat} (ref <30)
Microalb, Ur: 1.6 mg/dL

## 2024-06-01 LAB — TSH: TSH: 2.68 m[IU]/L (ref 0.40–4.50)

## 2024-06-01 LAB — COMPREHENSIVE METABOLIC PANEL WITH GFR
AG Ratio: 1.4 (calc) (ref 1.0–2.5)
ALT: 10 U/L (ref 6–29)
AST: 16 U/L (ref 10–35)
Albumin: 4.3 g/dL (ref 3.6–5.1)
Alkaline phosphatase (APISO): 98 U/L (ref 37–153)
BUN: 15 mg/dL (ref 7–25)
CO2: 27 mmol/L (ref 20–32)
Calcium: 9.3 mg/dL (ref 8.6–10.4)
Chloride: 105 mmol/L (ref 98–110)
Creat: 0.73 mg/dL (ref 0.50–1.05)
Globulin: 3 g/dL (ref 1.9–3.7)
Glucose, Bld: 92 mg/dL (ref 65–99)
Potassium: 4.4 mmol/L (ref 3.5–5.3)
Sodium: 141 mmol/L (ref 135–146)
Total Bilirubin: 0.4 mg/dL (ref 0.2–1.2)
Total Protein: 7.3 g/dL (ref 6.1–8.1)
eGFR: 91 mL/min/1.73m2 (ref >=60)

## 2024-06-01 LAB — CBC WITH DIFFERENTIAL/PLATELET
Absolute Lymphocytes: 1610 {cells}/uL (ref 850–3900)
Absolute Monocytes: 744 {cells}/uL (ref 200–950)
Basophils Absolute: 31 {cells}/uL (ref 0–200)
Basophils Relative: 0.5 %
Eosinophils Absolute: 415 {cells}/uL (ref 15–500)
Eosinophils Relative: 6.8 %
HCT: 38.8 % (ref 35.0–45.0)
Hemoglobin: 11.3 g/dL — ABNORMAL LOW (ref 11.7–15.5)
MCH: 22.2 pg — ABNORMAL LOW (ref 27.0–33.0)
MCHC: 29.1 g/dL — ABNORMAL LOW (ref 32.0–36.0)
MCV: 76.2 fL — ABNORMAL LOW (ref 80.0–100.0)
MPV: 11.1 fL (ref 7.5–12.5)
Monocytes Relative: 12.2 %
Neutro Abs: 3300 {cells}/uL (ref 1500–7800)
Neutrophils Relative %: 54.1 %
Platelets: 318 Thousand/uL (ref 140–400)
RBC: 5.09 Million/uL (ref 3.80–5.10)
RDW: 14.5 % (ref 11.0–15.0)
Total Lymphocyte: 26.4 %
WBC: 6.1 Thousand/uL (ref 3.8–10.8)

## 2024-06-01 LAB — HEMOGLOBIN A1C
Hgb A1c MFr Bld: 5.9 % — ABNORMAL HIGH (ref <5.7)
Mean Plasma Glucose: 123 mg/dL
eAG (mmol/L): 6.8 mmol/L

## 2024-06-03 NOTE — Progress Notes (Signed)
 Annual Wellness Visit   Patient Care Team: Perri Ronal PARAS, MD as PCP - General (Internal Medicine)  Visit Date: 06/04/24   Chief Complaint  Patient presents with   Annual Exam   Medicare Wellness   Subjective:  Patient: Barbara [name], Female DOB: 1955-11-25, 68 y.o. MRN: [id number] Vitals:   06/04/24 1410 06/04/24 1428 06/04/24 1438  BP: (!) 130/90 (!) 130/92 130/88   Barbara [name] is a 68 y.o. Female who presents today for her Annual Wellness Visit. Patient  has hx of hyperlipidemia, essential hypertension, type 2 diabetes mellitus, obesity, dependent edema, vitamin D  deficiency, allergic rhinitis, thalassemia trait.  She reports she is having left knee arthroplasty in December with Dr. Hiram.   On 05/29/2024 she went to Urgent Care for productive cough, congestion, headache. She was prescribed Augmentin 875-125 mg every 12 hours for 7 days. She says that she does feel better but she still has a cough.   History of Diabetes Mellitus, Type II controlled with diet and exercise. 05/31/2024 HgbA1c 5.9% down from 6.0% on 03/08/2024.    History of Hyperlipidemia treated with Rosuvastatin  10 mg. 05/31/2024 Lipid panel HDL 45, LDL 113, otherwise WNL.   History of Hypertension treated with Losartan  25 mg daily. Blood pressure today is mildly hypertensive at 130/90.   History of Vitamin D  deficiency treated with Drisdol  50,000 units weekly.    History of Dependent Edema treated with Furosemide  20 mg daily.    History of Allergic Rhinitis treated with Flosnase nasal spray.    History of thalassemia trait. 05/31/2024 CBC Hemoglobin 11.3, MCV 76.2, MCH 22.2, MCHC 29.1.   Hysterectomy without oophorectomy 2009. Dr. Marget is GYN physician   History of  cervical spinal stenosis seen by Dr. Gillie in 2010.   Considerable problem for several months with Right Achilles tendinitis.   She has been seen [facility] for knee pain.History of torn meniscus right knee.   Labs  05/31/2024 Hemoglobin 11.3,  MCV 76.2, MCH 22.2, MCHC 29.1, HDL 45, LDL 113, HgbA1c 5.9%, Otherwise WNL    05/27/2023 No Mammographic evidence of malignancy. Repeat in one year.    10/05/2010 colonoscopy normal. Repeat in 2027.    Vaccine counseling: Influenza vaccine deferred.   Health Maintenance  Topic Date Due   Mammogram  06/13/2023   Influenza Vaccine  03/12/2024   COVID-19 Vaccine (5 - 2025-26 season) 04/12/2024   Zoster Vaccines- Shingrix (1 of 2) 06/12/2024 (Originally 11/19/2007)   DEXA SCAN  06/04/2025 (Originally 11/19/2022)   OPHTHALMOLOGY EXAM  07/03/2024   HEMOGLOBIN A1C  11/29/2024   Diabetic kidney evaluation - eGFR measurement  05/31/2025   Diabetic kidney evaluation - Urine ACR  05/31/2025   FOOT EXAM  06/04/2025   Medicare Annual Wellness (AWV)  06/04/2025   Colonoscopy  09/29/2025   DTaP/Tdap/Td (3 - Td or Tdap) 06/23/2030   Pneumococcal Vaccine: 50+ Years  Completed   Meningococcal B Vaccine  Aged Out   Hepatitis C Screening  Discontinued     Review of Systems  Constitutional:  Negative for fever and malaise/fatigue.  HENT:  Positive for congestion.   Eyes:  Negative for blurred vision.  Respiratory:  Positive for cough and sputum production. Negative for shortness of breath.   Cardiovascular:  Negative for chest pain, palpitations and leg swelling.  Gastrointestinal:  Negative for vomiting.  Musculoskeletal:  Negative for back pain.  Skin:  Negative for rash.  Neurological:  Negative for loss of consciousness and headaches.   Objective:  Vitals: body mass index is 40.44 kg/m. Today's Vitals   06/04/24 1410 06/04/24 1428 06/04/24 1438  BP: (!) 130/90 (!) 130/92 130/88  Pulse: 74    SpO2: 97%    Weight: 243 lb (110.2 kg)    Height: 5' 5 (1.651 m)    PainSc: 0-No pain     Physical Exam Vitals and nursing note reviewed.  Constitutional:      General: She is not in acute distress.    Appearance: Normal appearance. She is not ill-appearing or  toxic-appearing.  HENT:     Head: Normocephalic and atraumatic.     Right Ear: Hearing, tympanic membrane, ear canal and external ear normal.     Left Ear: Hearing, tympanic membrane, ear canal and external ear normal.     Mouth/Throat:     Pharynx: Oropharynx is clear.  Eyes:     Extraocular Movements: Extraocular movements intact.     Pupils: Pupils are equal, round, and reactive to light.  Neck:     Thyroid : No thyroid  mass, thyromegaly or thyroid  tenderness.     Vascular: No carotid bruit.  Cardiovascular:     Rate and Rhythm: Normal rate and regular rhythm. No extrasystoles are present.    Pulses:          Dorsalis pedis pulses are 2+ on the right side and 2+ on the left side.     Heart sounds: Normal heart sounds. No murmur heard.    No friction rub. No gallop.  Pulmonary:     Effort: Pulmonary effort is normal.     Breath sounds: Normal breath sounds. No decreased breath sounds, wheezing, rhonchi or rales.  Chest:     Chest wall: No mass.  Abdominal:     Palpations: Abdomen is soft. There is no hepatomegaly, splenomegaly or mass.     Tenderness: There is no abdominal tenderness.     Hernia: No hernia is present.  Musculoskeletal:     Cervical back: Normal range of motion.     Right lower leg: No edema.     Left lower leg: No edema.  Lymphadenopathy:     Cervical: No cervical adenopathy.     Upper Body:     Right upper body: No supraclavicular adenopathy.     Left upper body: No supraclavicular adenopathy.  Skin:    General: Skin is warm and dry.  Neurological:     General: No focal deficit present.     Mental Status: She is alert and oriented to person, place, and time. Mental status is at baseline.     Sensory: Sensation is intact.     Motor: Motor function is intact. No weakness.     Deep Tendon Reflexes: Reflexes are normal and symmetric.  Psychiatric:        Attention and Perception: Attention normal.        Mood and Affect: Mood normal.        Speech:  Speech normal.        Behavior: Behavior normal.        Thought Content: Thought content normal.        Cognition and Memory: Cognition normal.        Judgment: Judgment normal.     Current Outpatient Medications  Medication Instructions   amoxicillin-clavulanate (AUGMENTIN) 875-125 MG tablet 1 tablet, Oral, Every 12 hours   aspirin  EC 81 mg, Oral, Daily, Swallow whole.   fluticasone  (FLONASE ) 50 MCG/ACT nasal spray 2 sprays, Each Nare, Daily  furosemide  (LASIX ) 20 mg, Oral, Daily   HYDROcodone  bit-homatropine (HYCODAN) 5-1.5 MG/5ML syrup 5 mLs, Oral, Every 8 hours PRN   levofloxacin  (LEVAQUIN ) 500 mg, Oral, Daily   losartan  (COZAAR ) 25 mg, Oral, Daily   meloxicam  (MOBIC ) 15 mg, Oral, Daily   Multiple Vitamin (MULTIVITAMIN) tablet 1 tablet, Daily   promethazine-dextromethorphan (PROMETHAZINE-DM) 6.25-15 MG/5ML syrup 5 mLs, Oral, 3 times daily PRN   rosuvastatin  (CRESTOR ) 10 mg, Oral, Daily   Past Medical History:  Diagnosis Date   Allergy    allergic rhinitis   Anemia    Arthritis    l k nee osteo   Diabetes mellitus without complication (HCC)    diet controlled   Hyperlipidemia    Leg swelling    a. RLE edema, intermittent, 2 negative duplexes in 2015.   Low back pain    Obesity    Medical/Surgical History Narrative:  Allergic/Intolerant to:  Allergies  Allergen Reactions   Sulfamethoxazole Hives   Sulfonamide Derivatives     REACTION: hives    Past Surgical History:  Procedure Laterality Date   ABDOMINAL HYSTERECTOMY     BREAST SURGERY     reduction   LIGAMENT REPAIR Right 05/24/2014   Procedure:  RECONSTRUCTION RADIAL COLLATERAL LIGAMENT METACARPAL PHALANGEAL JOINT RIGHT MIDDLE FINGER WITH PALMARIS LONGUS GRAFT;  Surgeon: Arley Curia, MD;  Location: [facility];  Service: Orthopedics;  Laterality: Right;   Family History  Problem Relation Age of Onset   Hypertension Mother    Stroke Mother    COPD Father    Diabetes Brother    Asthma Other     Hypertension Other    Hyperlipidemia Other    Stroke Other     Social History   Social History Narrative   Social history: Does not smoke or consume alcohol.  Resides alone.  Has a college degree.  Has worked at [employer] for over 30 years.  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       Family history: Father deceased with history of asthma.  Mother with history of CVA and hypertension.  1 brother and 2 sisters in good health.       Most Recent Health Risks Assessment:   Medicare Risk at Home - 06/04/24 1411     Any stairs in or around the home? Yes    If so, are there any without handrails? Yes    Home free of loose throw rugs in walkways, pet beds, electrical cords, etc? Yes    Adequate lighting in your home to reduce risk of falls? Yes    Life alert? No    Use of a cane, walker or w/c? No    Grab bars in the bathroom? No    Shower chair or bench in shower? No    Elevated toilet seat or a handicapped toilet? No         Most Recent Social Determinants of Health (Including Hx of Tobacco, Alcohol, and Drug Use) SDOH Screenings   Food Insecurity: No Food Insecurity (06/04/2024)  Housing: Low Risk  (06/04/2024)  Transportation Needs: No Transportation Needs (06/04/2024)  Utilities: Not At Risk (05/23/2023)  Alcohol Screen: Low Risk  (06/04/2024)  Depression (PHQ2-9): Low Risk  (06/04/2024)  Financial Resource Strain: Low Risk  (03/08/2024)  Physical Activity: Insufficiently Active (06/04/2024)  Social Connections: Moderately Integrated (03/08/2024)  Stress: No Stress Concern Present (03/08/2024)  Tobacco Use: Low Risk  (06/04/2024)  Health Literacy: Adequate Health Literacy (05/23/2023)   Social History  Tobacco Use   Smoking status: Never   Smokeless tobacco: Never  Substance Use Topics   Alcohol use: Yes    Comment: rare   Drug use: No    Most Recent Fall Risk Assessment:    06/04/2024    2:08 PM  Fall Risk   Falls in the past year? 0  Number falls in  past yr: 0  Injury with Fall? 0  Risk for fall due to : No Fall Risks  Follow up Education provided;Falls prevention discussed;Falls evaluation completed   Most Recent Anxiety/Depression Screenings:    06/04/2024    2:07 PM 05/23/2023   11:11 AM  PHQ 2/9 Scores  PHQ - 2 Score 0 0    Most Recent Cognitive Screening:    06/04/2024    2:44 PM  6CIT Screen  What Year? 0 points  What month? 0 points  What time? 0 points  Count back from 20 0 points  Months in reverse 0 points  Repeat phrase 0 points  Total Score 0 points   Results:  Studies Obtained And Personally Reviewed By Me:  05/27/2023 No Mammographic evidence of malignancy. Repeat in one year.    10/05/2010 colonoscopy normal. Repeat in 2027.   Labs:  CBC w/ Differential Lab Results  Component Value Date   WBC 6.1 05/31/2024   RBC 5.09 05/31/2024   HGB 11.3 (L) 05/31/2024   HCT 38.8 05/31/2024   PLT 318 05/31/2024   MCV 76.2 (L) 05/31/2024   MCH 22.2 (L) 05/31/2024   MCHC 29.1 (L) 05/31/2024   RDW 14.5 05/31/2024   MPV 11.1 05/31/2024   LYMPHSABS 1,746 05/20/2023   MONOABS 0.4 04/20/2015   BASOSABS 31 05/31/2024    Comprehensive Metabolic Panel Lab Results  Component Value Date   NA 141 05/31/2024   K 4.4 05/31/2024   CL 105 05/31/2024   CO2 27 05/31/2024   GLUCOSE 92 05/31/2024   BUN 15 05/31/2024   CREATININE 0.73 05/31/2024   CALCIUM  9.3 05/31/2024   PROT 7.3 05/31/2024   ALBUMIN 4.2 04/20/2015   AST 16 05/31/2024   ALT 10 05/31/2024   ALKPHOS 82 04/20/2015   BILITOT 0.4 05/31/2024   GFR 102.35 02/15/2015   EGFR 91 05/31/2024   GFRNONAA 84 06/22/2020   Lipid Panel  Lab Results  Component Value Date   CHOL 177 05/31/2024   HDL 45 (L) 05/31/2024   LDLCALC 113 (H) 05/31/2024   TRIG 86 05/31/2024   A1c Lab Results  Component Value Date   HGBA1C 5.9 (H) 05/31/2024    TSH Lab Results  Component Value Date   TSH 2.68 05/31/2024    Assessment & Plan:   Orders Placed This  Encounter  Procedures   POCT URINALYSIS DIP (CLINITEK)   Meds ordered this encounter  Medications   levofloxacin  (LEVAQUIN ) 500 MG tablet    Sig: Take 1 tablet (500 mg total) by mouth daily for 7 days.    Dispense:  7 tablet    Refill:  0   HYDROcodone  bit-homatropine (HYCODAN) 5-1.5 MG/5ML syrup    Sig: Take 5 mLs by mouth every 8 (eight) hours as needed for cough.    Dispense:  120 mL    Refill:  0   She is having knee surgery in December.   Acute lower respiratory infection: On 05/29/2024 she went to the urgent care for productive cough, congestion, headache. She was prescribed Augmentin 875-125 mg every 12 hours and Promethazine. She says that she does feel  better but she still has a cough.   Levaquin  500 mg daily prescribed   Hycodan 5 mL every 8 hours as needed prescribed.  Diabetes Mellitus, Type II: controlled with diet and exercise. 05/31/2024 HgbA1c 5.9% down from 6.0% on 03/08/2024.    Hyperlipidemia: treated with Rosuvastatin  10 mg. 05/31/2024 Lipid panel HDL 45, LDL 113, otherwise WNL.   Hypertension: treated with Losartan  25 mg daily. Blood pressure today is hypertensive at 130/90.   Vitamin D : deficiency treated with Drisdol  50,000 units weekly.    Dependent Edema: treated with Furosemide  20 mg daily.    Allergic Rhinitis: treated with Flosnase nasal spray.    Thalassemia trait: 05/31/2024 CBC Hemoglobin 11.3, MCV 76.2, MCH 22.2, MCHC 29.1.   05/27/2023 No Mammographic evidence of malignancy. Repeat in one year.    10/05/2010 colonoscopy normal. Repeat in 2027.    Vaccine counseling: Influenza vaccine deferred.      Annual Wellness Visit done today including the all of the following: Reviewed patient's Family Medical History Reviewed patient's SDOH and reviewed tobacco, alcohol, and drug use.  Reviewed and updated list of patient's medical providers Assessment of cognitive impairment was done Assessed patient's functional ability Established a written  schedule for health screening services Health Risk Assessent Completed and Reviewed  Discussed health benefits of physical activity, and encouraged her to engage in regular exercise appropriate for her age and condition.   I,Makayla C Reid,acting as a scribe for Ronal JINNY Hailstone, MD.,have documented all relevant documentation on the behalf of Ronal JINNY Hailstone, MD,as directed by  Ronal JINNY Hailstone, MD while in the presence of Ronal JINNY Hailstone, MD.  I, Ronal JINNY Hailstone, MD, have reviewed all documentation for and agree with the above Annual Wellness Visit documentation.  Ronal JINNY Hailstone, MD Internal Medicine 06/04/2024

## 2024-06-04 ENCOUNTER — Ambulatory Visit: Admitting: Internal Medicine

## 2024-06-04 ENCOUNTER — Encounter: Payer: Self-pay | Admitting: Internal Medicine

## 2024-06-04 VITALS — BP 130/88 | HR 74 | Ht 65.0 in | Wt 243.0 lb

## 2024-06-04 DIAGNOSIS — E1169 Type 2 diabetes mellitus with other specified complication: Secondary | ICD-10-CM

## 2024-06-04 DIAGNOSIS — E785 Hyperlipidemia, unspecified: Secondary | ICD-10-CM

## 2024-06-04 DIAGNOSIS — E119 Type 2 diabetes mellitus without complications: Secondary | ICD-10-CM | POA: Diagnosis not present

## 2024-06-04 DIAGNOSIS — I10 Essential (primary) hypertension: Secondary | ICD-10-CM

## 2024-06-04 DIAGNOSIS — J301 Allergic rhinitis due to pollen: Secondary | ICD-10-CM

## 2024-06-04 DIAGNOSIS — M1712 Unilateral primary osteoarthritis, left knee: Secondary | ICD-10-CM

## 2024-06-04 DIAGNOSIS — D563 Thalassemia minor: Secondary | ICD-10-CM

## 2024-06-04 DIAGNOSIS — Z0000 Encounter for general adult medical examination without abnormal findings: Secondary | ICD-10-CM | POA: Diagnosis not present

## 2024-06-04 DIAGNOSIS — E559 Vitamin D deficiency, unspecified: Secondary | ICD-10-CM

## 2024-06-04 DIAGNOSIS — E7800 Pure hypercholesterolemia, unspecified: Secondary | ICD-10-CM

## 2024-06-04 DIAGNOSIS — I5189 Other ill-defined heart diseases: Secondary | ICD-10-CM

## 2024-06-04 DIAGNOSIS — R609 Edema, unspecified: Secondary | ICD-10-CM

## 2024-06-04 DIAGNOSIS — Z6841 Body Mass Index (BMI) 40.0 and over, adult: Secondary | ICD-10-CM

## 2024-06-04 DIAGNOSIS — J22 Unspecified acute lower respiratory infection: Secondary | ICD-10-CM

## 2024-06-04 LAB — POCT URINALYSIS DIP (CLINITEK)
Bilirubin, UA: NEGATIVE
Blood, UA: NEGATIVE
Glucose, UA: NEGATIVE mg/dL
Ketones, POC UA: NEGATIVE mg/dL
Leukocytes, UA: NEGATIVE
Nitrite, UA: NEGATIVE
Spec Grav, UA: 1.015 (ref 1.010–1.025)
Urobilinogen, UA: 0.2 U/dL
pH, UA: 7.5 (ref 5.0–8.0)

## 2024-06-04 MED ORDER — LEVOFLOXACIN 500 MG PO TABS: 500.0000 mg | ORAL_TABLET | Freq: Every day | ORAL | 0 refills | Status: AC

## 2024-06-04 MED ORDER — HYDROCODONE BIT-HOMATROP MBR 5-1.5 MG/5ML PO SOLN: 5.0000 mL | Freq: Three times a day (TID) | ORAL | 0 refills | Status: DC | PRN

## 2024-06-04 NOTE — Progress Notes (Signed)
 Subjective:   Barbara [name] is a 68 y.o. female who presents for Medicare Annual (Subsequent) preventive examination.  Visit Complete: In person  Patient Medicare AWV questionnaire was completed by the patient on 06/05/2023; I have confirmed that all information answered by patient is correct and no changes since this date.  Cardiac Risk Factors include: advanced age (>83men, >83 women);diabetes mellitus;dyslipidemia     Objective:    Today's Vitals   06/04/24 1410 06/04/24 1428 06/04/24 1438  BP: (!) 130/90 (!) 130/92 130/88  Pulse: 74    SpO2: 97%    Weight: 243 lb (110.2 kg)    Height: 5' 5 (1.651 m)    PainSc: 0-No pain     Body mass index is 40.44 kg/m.     06/04/2024    2:12 PM 05/23/2023   11:11 AM 05/11/2014    2:44 PM  Advanced Directives  Does Patient Have a Medical Advance Directive? No No No   Would patient like information on creating a medical advance directive? No - Patient declined No - Patient declined No - patient declined information      Data saved with a previous flowsheet row definition    Current Medications (verified) Outpatient Encounter Medications as of 06/04/2024  Medication Sig   amoxicillin-clavulanate (AUGMENTIN) 875-125 MG tablet Take 1 tablet by mouth every 12 (twelve) hours.   aspirin  81 MG EC tablet Take 1 tablet (81 mg total) by mouth daily. Swallow whole.   fluticasone  (FLONASE ) 50 MCG/ACT nasal spray Place 2 sprays into both nostrils daily.   furosemide  (LASIX ) 20 MG tablet Take 1 tablet (20 mg total) by mouth daily.   HYDROcodone  bit-homatropine (HYCODAN) 5-1.5 MG/5ML syrup Take 5 mLs by mouth every 8 (eight) hours as needed for cough.   levofloxacin  (LEVAQUIN ) 500 MG tablet Take 1 tablet (500 mg total) by mouth daily for 7 days.   losartan  (COZAAR ) 25 MG tablet Take 1 tablet (25 mg total) by mouth daily.   meloxicam  (MOBIC ) 15 MG tablet TAKE 1 TABLET (15 MG TOTAL) BY MOUTH DAILY.   Multiple Vitamin (MULTIVITAMIN) tablet  Take 1 tablet by mouth daily.   promethazine-dextromethorphan (PROMETHAZINE-DM) 6.25-15 MG/5ML syrup Take 5 mLs by mouth 3 (three) times daily as needed for cough.   rosuvastatin  (CRESTOR ) 10 MG tablet Take 1 tablet (10 mg total) by mouth daily.   No facility-administered encounter medications on file as of 06/04/2024.    Allergies (verified) Sulfamethoxazole and Sulfonamide derivatives   History: Past Medical History:  Diagnosis Date   Allergy    allergic rhinitis   Anemia    Arthritis    l k nee osteo   Diabetes mellitus without complication (HCC)    diet controlled   Hyperlipidemia    Leg swelling    a. RLE edema, intermittent, 2 negative duplexes in 2015.   Low back pain    Obesity    Past Surgical History:  Procedure Laterality Date   ABDOMINAL HYSTERECTOMY     BREAST SURGERY     reduction   LIGAMENT REPAIR Right 05/24/2014   Procedure:  RECONSTRUCTION RADIAL COLLATERAL LIGAMENT METACARPAL PHALANGEAL JOINT RIGHT MIDDLE FINGER WITH PALMARIS LONGUS GRAFT;  Surgeon: Arley Curia, MD;  Location: [facility];  Service: Orthopedics;  Laterality: Right;   Family History  Problem Relation Age of Onset   Hypertension Mother    Stroke Mother    COPD Father    Diabetes Brother    Asthma Other    Hypertension  Other    Hyperlipidemia Other    Stroke Other    Social History   Socioeconomic History   Marital status: Single    Spouse name: Not on file   Number of children: 0   Years of education: Not on file   Highest education level: Bachelor's degree (e.g., BA, AB, BS)  Occupational History   Occupation: She works on [employer]  Tobacco Use   Smoking status: Never   Smokeless tobacco: Never  Substance and Sexual Activity   Alcohol use: Yes    Comment: rare   Drug use: No   Sexual activity: Yes  Other Topics Concern   Not on file  Social History Narrative   Social history: Does not smoke or consume alcohol.  Resides alone.  Has a college degree.  Has  worked at [employer] for over 30 years.  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       Family history: Father deceased with history of asthma.  Mother with history of CVA and hypertension.  1 brother and 2 sisters in good health.       Social Drivers of Corporate Investment Banker Strain: Low Risk  (03/08/2024)   Overall Financial Resource Strain (CARDIA)    Difficulty of Paying Living Expenses: Not hard at all  Food Insecurity: No Food Insecurity (06/04/2024)   Hunger Vital Sign    Worried About Running Out of Food in the Last Year: Never true    Ran Out of Food in the Last Year: Never true  Transportation Needs: No Transportation Needs (06/04/2024)   PRAPARE - Administrator, Civil Service (Medical): No    Lack of Transportation (Non-Medical): No  Physical Activity: Insufficiently Active (06/04/2024)   Exercise Vital Sign    Days of Exercise per Week: 2 days    Minutes of Exercise per Session: 30 min  Stress: No Stress Concern Present (03/08/2024)   [employer] of Occupational Health - Occupational Stress Questionnaire    Feeling of Stress: Not at all  Social Connections: Moderately Integrated (03/08/2024)   Social Connection and Isolation Panel    Frequency of Communication with Friends and Family: More than three times a week    Frequency of Social Gatherings with Friends and Family: More than three times a week    Attends Religious Services: 1 to 4 times per year    Active Member of [employer] or Organizations: No    Attends Engineer, Structural: Not on file    Marital Status: Living with partner    Tobacco Counseling Counseling given: No   Clinical Intake:     Pain Score: 0-No pain                  Activities of Daily Living    06/04/2024    2:10 PM  In your present state of health, do you have any difficulty performing the following activities:  Hearing? 1  Vision? 0  Difficulty concentrating or making decisions? 0  Walking or  climbing stairs? 1  Comment left knee  Dressing or bathing? 0  Doing errands, shopping? 0  Preparing Food and eating ? N  Using the Toilet? N  In the past six months, have you accidently leaked urine? N  Do you have problems with loss of bowel control? N  Managing your Medications? N  Managing your Finances? N  Housekeeping or managing your Housekeeping? N    Patient Care Team: Perri Ronal PARAS, MD as  PCP - General (Internal Medicine)  Indicate any recent Medical Services you may have received from other than [facility] providers in the past year (date may be approximate).     Assessment:   This is a routine wellness examination for Shatia.  Hearing/Vision screen No results found.   Goals Addressed   None    Depression Screen    06/04/2024    2:07 PM 05/23/2023   11:11 AM 09/12/2022    4:27 PM 08/14/2022   10:17 AM 06/21/2022    9:33 AM 08/01/2021   10:09 AM 06/23/2020    3:14 PM  PHQ 2/9 Scores  PHQ - 2 Score 0 0 0 0 0 0 0    Fall Risk    06/04/2024    2:08 PM 05/23/2023   11:11 AM 09/12/2022    4:27 PM 08/14/2022   10:17 AM 06/21/2022    9:33 AM  Fall Risk   Falls in the past year? 0 0 0 0 0  Number falls in past yr: 0 0 0 0 0  Injury with Fall? 0 0 0 0 0  Risk for fall due to : No Fall Risks No Fall Risks No Fall Risks No Fall Risks No Fall Risks  Follow up Education provided;Falls prevention discussed;Falls evaluation completed Education provided;Falls evaluation completed;Falls prevention discussed Falls prevention discussed Falls prevention discussed  Falls evaluation completed      Data saved with a previous flowsheet row definition    MEDICARE RISK AT HOME: Medicare Risk at Home Any stairs in or around the home?: Yes If so, are there any without handrails?: Yes Home free of loose throw rugs in walkways, pet beds, electrical cords, etc?: Yes Adequate lighting in your home to reduce risk of falls?: Yes Life alert?: No Use of a cane, walker or w/c?: No Grab  bars in the bathroom?: No Shower chair or bench in shower?: No Elevated toilet seat or a handicapped toilet?: No  TIMED UP AND GO:  Was the test performed?  No    Cognitive Function:        06/04/2024    2:44 PM 05/23/2023   11:11 AM  6CIT Screen  What Year? 0 points 0 points  What month? 0 points 0 points  What time? 0 points 0 points  Count back from 20 0 points 0 points  Months in reverse 0 points 0 points  Repeat phrase 0 points 0 points  Total Score 0 points 0 points    Immunizations Immunization History  Administered Date(s) Administered   Influenza,inj,Quad PF,6+ Mos 08/01/2021   PFIZER(Purple Top)SARS-COV-2 Vaccination 10/30/2019, 11/20/2019   PNEUMOCOCCAL CONJUGATE-20 05/23/2023   Tdap 03/13/2010, 06/23/2020   Unspecified SARS-COV-2 Vaccination 11/03/2019, 11/17/2019    TDAP status: Up to date  Flu Vaccine status: Due, Education has been provided regarding the importance of this vaccine. Advised may receive this vaccine at local pharmacy or Health Dept. Aware to provide a copy of the vaccination record if obtained from local pharmacy or Health Dept. Verbalized acceptance and understanding.  Pneumococcal vaccine status: Up to date  Covid-19 vaccine status: Information provided on how to obtain vaccines.   Qualifies for Shingles Vaccine? Yes   Zostavax completed No   Shingrix Completed?: No.    Education has been provided regarding the importance of this vaccine. Patient has been advised to call insurance company to determine out of pocket expense if they have not yet received this vaccine. Advised may also receive vaccine at local pharmacy or Health  Dept. Verbalized acceptance and understanding.  Screening Tests Health Maintenance  Topic Date Due   Mammogram  06/13/2023   Influenza Vaccine  03/12/2024   COVID-19 Vaccine (5 - 2025-26 season) 04/12/2024   Zoster Vaccines- Shingrix (1 of 2) 06/12/2024 (Originally 11/19/2007)   DEXA SCAN  06/04/2025 (Originally  11/19/2022)   OPHTHALMOLOGY EXAM  07/03/2024   HEMOGLOBIN A1C  11/29/2024   Diabetic kidney evaluation - eGFR measurement  05/31/2025   Diabetic kidney evaluation - Urine ACR  05/31/2025   FOOT EXAM  06/04/2025   Medicare Annual Wellness (AWV)  06/04/2025   Colonoscopy  09/29/2025   DTaP/Tdap/Td (3 - Td or Tdap) 06/23/2030   Pneumococcal Vaccine: 50+ Years  Completed   Meningococcal B Vaccine  Aged Out   Hepatitis C Screening  Discontinued    Health Maintenance  Health Maintenance Due  Topic Date Due   Mammogram  06/13/2023   Influenza Vaccine  03/12/2024   COVID-19 Vaccine (5 - 2025-26 season) 04/12/2024    Colorectal cancer screening: Type of screening: Colonoscopy. Completed 09/2020. Repeat every 10 years  Mammogram status: Ordered 06/04/2024. Pt provided with contact info and advised to call to schedule appt.   Bone Density status: Ordered 06/04/2024. Pt provided with contact info and advised to call to schedule appt.  Lung Cancer Screening: (Low Dose CT Chest recommended if Age 64-80 years, 20 pack-year currently smoking OR have quit w/in 15years.) does not qualify.   Additional Screening:  Hepatitis C Screening: does not qualify; Completed   Vision Screening: Recommended annual ophthalmology exams for early detection of glaucoma and other disorders of the eye. Is the patient up to date with their annual eye exam?  Yes  Who is the provider or what is the name of the office in which the patient attends annual eye exams? [facility]  If pt is not established with a provider, would they like to be referred to a provider to establish care? No .   Dental Screening: Recommended annual dental exams for proper oral hygiene  Diabetic Foot Exam: Diabetic Foot Exam: Completed 06/04/2024  Community Resource Referral / Chronic Care Management: CRR required this visit?  No   CCM required this visit?  No     Plan:     I have personally reviewed and noted the  following in the patient's chart:   Medical and social history Use of alcohol, tobacco or illicit drugs  Current medications and supplements including opioid prescriptions. Patient is not currently taking opioid prescriptions. Functional ability and status Nutritional status Physical activity Advanced directives List of other physicians Hospitalizations, surgeries, and ER visits in previous 12 months Vitals Screenings to include cognitive, depression, and falls Referrals and appointments  In addition, I have reviewed and discussed with patient certain preventive protocols, quality metrics, and best practice recommendations. A written personalized care plan for preventive services as well as general preventive health recommendations were provided to patient.     Araceli Zelda, CMA   06/04/2024   After Visit Summary: (In Person-Printed) AVS printed and given to the patient  I, Ronal JINNY Hailstone, MD, have reviewed all documentation for this visit. The documentation on 06/04/2024 for the exam, diagnosis, procedures, and orders are all accurate and complete.

## 2024-06-04 NOTE — Patient Instructions (Addendum)
 Barbara [name] , Thank you for taking time to come for your Medicare Wellness Visit. I appreciate your ongoing commitment to your health goals. Please review the following plan we discussed and let me know if I can assist you in the future.   These are the goals we discussed:  Goals   None     This is a list of the screening recommended for you and due dates:  Health Maintenance  Topic Date Due   Breast Cancer Screening  06/13/2023   Flu Shot  03/12/2024   COVID-19 Vaccine (5 - 2025-26 season) 04/12/2024   Medicare Annual Wellness Visit  05/22/2024   Zoster (Shingles) Vaccine (1 of 2) 06/12/2024*   DEXA scan (bone density measurement)  06/04/2025*   Eye exam for diabetics  07/03/2024   Hemoglobin A1C  11/29/2024   Yearly kidney function blood test for diabetes  05/31/2025   Yearly kidney health urinalysis for diabetes  05/31/2025   Complete foot exam   06/04/2025   Colon Cancer Screening  09/29/2025   DTaP/Tdap/Td vaccine (3 - Td or Tdap) 06/23/2030   Pneumococcal Vaccine for age over 50  Completed   Meningitis B Vaccine  Aged Out   Hepatitis C Screening  Discontinued  *Topic was postponed. The date shown is not the original due date.    Next appointment: Follow up in one year for your annual wellness visit    Preventive Care 65 Years and Older, Female Preventive care refers to lifestyle choices and visits with your health care provider that can promote health and wellness. What does preventive care include? A yearly physical exam. This is also called an annual well check. Dental exams once or twice a year. Routine eye exams. Ask your health care provider how often you should have your eyes checked. Personal lifestyle choices, including: Daily care of your teeth and gums. Regular physical activity. Eating a healthy diet. Avoiding tobacco and drug use. Limiting alcohol use. Practicing safe sex. Taking low-dose aspirin  every day. Taking vitamin and mineral supplements as  recommended by your health care provider. What happens during an annual well check? The services and screenings done by your health care provider during your annual well check will depend on your age, overall health, lifestyle risk factors, and family history of disease. Counseling  Your health care provider may ask you questions about your: Alcohol use. Tobacco use. Drug use. Emotional well-being. Home and relationship well-being. Sexual activity. Eating habits. History of falls. Memory and ability to understand (cognition). Work and work astronomer. Reproductive health. Screening  You may have the following tests or measurements: Height, weight, and BMI. Blood pressure. Lipid and cholesterol levels. These may be checked every 5 years, or more frequently if you are over 42 years old. Skin check. Lung cancer screening. You may have this screening every year starting at age 71 if you have a 30-pack-year history of smoking and currently smoke or have quit within the past 15 years. Fecal occult blood test (FOBT) of the stool. You may have this test every year starting at age 68. Flexible sigmoidoscopy or colonoscopy. You may have a sigmoidoscopy every 5 years or a colonoscopy every 10 years starting at age 51. Hepatitis C blood test. Hepatitis B blood test. Sexually transmitted disease (STD) testing. Diabetes screening. This is done by checking your blood sugar (glucose) after you have not eaten for a while (fasting). You may have this done every 1-3 years. Bone density scan. This is done to screen for  osteoporosis. You may have this done starting at age 74. Mammogram. This may be done every 1-2 years. Talk to your health care provider about how often you should have regular mammograms. Talk with your health care provider about your test results, treatment options, and if necessary, the need for more tests. Vaccines  Your health care provider may recommend certain vaccines, such  as: Influenza vaccine. This is recommended every year. Tetanus, diphtheria, and acellular pertussis (Tdap, Td) vaccine. You may need a Td booster every 10 years. Zoster vaccine. You may need this after age 19. Pneumococcal 13-valent conjugate (PCV13) vaccine. One dose is recommended after age 31. Pneumococcal polysaccharide (PPSV23) vaccine. One dose is recommended after age 57. Talk to your health care provider about which screenings and vaccines you need and how often you need them. This information is not intended to replace advice given to you by your health care provider. Make sure you discuss any questions you have with your health care provider. Document Released: 08/25/2015 Document Revised: 04/17/2016 Document Reviewed: 05/30/2015 Elsevier Interactive Patient Education  2017 [employer].  Fall Prevention in the Home Falls can cause injuries. They can happen to people of all ages. There are many things you can do to make your home safe and to help prevent falls. What can I do on the outside of my home? Regularly fix the edges of walkways and driveways and fix any cracks. Remove anything that might make you trip as you walk through a door, such as a raised step or threshold. Trim any bushes or trees on the path to your home. Use bright outdoor lighting. Clear any walking paths of anything that might make someone trip, such as rocks or tools. Regularly check to see if handrails are loose or broken. Make sure that both sides of any steps have handrails. Any raised decks and porches should have guardrails on the edges. Have any leaves, snow, or ice cleared regularly. Use sand or salt on walking paths during winter. Clean up any spills in your garage right away. This includes oil or grease spills. What can I do in the bathroom? Use night lights. Install grab bars by the toilet and in the tub and shower. Do not use towel bars as grab bars. Use non-skid mats or decals in the tub or  shower. If you need to sit down in the shower, use a plastic, non-slip stool. Keep the floor dry. Clean up any water that spills on the floor as soon as it happens. Remove soap buildup in the tub or shower regularly. Attach bath mats securely with double-sided non-slip rug tape. Do not have throw rugs and other things on the floor that can make you trip. What can I do in the bedroom? Use night lights. Make sure that you have a light by your bed that is easy to reach. Do not use any sheets or blankets that are too big for your bed. They should not hang down onto the floor. Have a firm chair that has side arms. You can use this for support while you get dressed. Do not have throw rugs and other things on the floor that can make you trip. What can I do in the kitchen? Clean up any spills right away. Avoid walking on wet floors. Keep items that you use a lot in easy-to-reach places. If you need to reach something above you, use a strong step stool that has a grab bar. Keep electrical cords out of the way. Do not  use floor polish or wax that makes floors slippery. If you must use wax, use non-skid floor wax. Do not have throw rugs and other things on the floor that can make you trip. What can I do with my stairs? Do not leave any items on the stairs. Make sure that there are handrails on both sides of the stairs and use them. Fix handrails that are broken or loose. Make sure that handrails are as long as the stairways. Check any carpeting to make sure that it is firmly attached to the stairs. Fix any carpet that is loose or worn. Avoid having throw rugs at the top or bottom of the stairs. If you do have throw rugs, attach them to the floor with carpet tape. Make sure that you have a light switch at the top of the stairs and the bottom of the stairs. If you do not have them, ask someone to add them for you. What else can I do to help prevent falls? Wear shoes that: Do not have high heels. Have  rubber bottoms. Are comfortable and fit you well. Are closed at the toe. Do not wear sandals. If you use a stepladder: Make sure that it is fully opened. Do not climb a closed stepladder. Make sure that both sides of the stepladder are locked into place. Ask someone to hold it for you, if possible. Clearly mark and make sure that you can see: Any grab bars or handrails. First and last steps. Where the edge of each step is. Use tools that help you move around (mobility aids) if they are needed. These include: Canes. Walkers. Scooters. Crutches. Turn on the lights when you go into a dark area. Replace any light bulbs as soon as they burn out. Set up your furniture so you have a clear path. Avoid moving your furniture around. If any of your floors are uneven, fix them. If there are any pets around you, be aware of where they are. Review your medicines with your doctor. Some medicines can make you feel dizzy. This can increase your chance of falling. Ask your doctor what other things that you can do to help prevent falls. This information is not intended to replace advice given to you by your health care provider. Make sure you discuss any questions you have with your health care provider. Document Released: 05/25/2009 Document Revised: 01/04/2016 Document Reviewed: 09/02/2014 Elsevier Interactive Patient Education  2017 [employer]  PATIENT IS TO WATCH DIET AND CONTINUE CURRENT MEDS. AGREES TO FLU VACCiNE AT NEXT VISIT. HAS ACUTE LOWER RESPIRATORY INFECTION WHICH WILL BE TREATED WITH HYCODAN AND LEVAQUIN  500 mg daily x 7 days.  She has pre-op clearance for orthopedic surgery soon. This was her annual wellness visit.

## 2024-06-18 ENCOUNTER — Other Ambulatory Visit

## 2024-06-18 DIAGNOSIS — D649 Anemia, unspecified: Secondary | ICD-10-CM

## 2024-06-19 LAB — CBC WITH DIFFERENTIAL/PLATELET
Absolute Lymphocytes: 1638 {cells}/uL (ref 850–3900)
Absolute Monocytes: 534 {cells}/uL (ref 200–950)
Basophils Absolute: 30 {cells}/uL (ref 0–200)
Basophils Relative: 0.5 %
Eosinophils Absolute: 354 {cells}/uL (ref 15–500)
Eosinophils Relative: 5.9 %
HCT: 36.7 % (ref 35.0–45.0)
Hemoglobin: 11 g/dL — ABNORMAL LOW (ref 11.7–15.5)
MCH: 22.6 pg — ABNORMAL LOW (ref 27.0–33.0)
MCHC: 30 g/dL — ABNORMAL LOW (ref 32.0–36.0)
MCV: 75.4 fL — ABNORMAL LOW (ref 80.0–100.0)
MPV: 10.4 fL (ref 7.5–12.5)
Monocytes Relative: 8.9 %
Neutro Abs: 3444 {cells}/uL (ref 1500–7800)
Neutrophils Relative %: 57.4 %
Platelets: 295 Thousand/uL (ref 140–400)
RBC: 4.87 Million/uL (ref 3.80–5.10)
RDW: 14.9 % (ref 11.0–15.0)
Total Lymphocyte: 27.3 %
WBC: 6 Thousand/uL (ref 3.8–10.8)

## 2024-06-19 LAB — IRON,TIBC AND FERRITIN PANEL
%SAT: 30 % (ref 16–45)
Ferritin: 42 ng/mL (ref 16–288)
Iron: 82 ug/dL (ref 45–160)
TIBC: 271 ug/dL (ref 250–450)

## 2024-06-19 LAB — RETICULOCYTES
ABS Retic: 43830 {cells}/uL (ref 20000–80000)
Retic Ct Pct: 0.9 %

## 2024-06-19 LAB — B12 AND FOLATE PANEL
Folate: 12.7 ng/mL
Vitamin B-12: 549 pg/mL (ref 200–1100)

## 2024-06-20 ENCOUNTER — Ambulatory Visit: Payer: Self-pay | Admitting: Internal Medicine

## 2024-06-21 NOTE — Progress Notes (Signed)
 Patient Care Team: Perri Ronal PARAS, MD as PCP - General (Internal Medicine)  Visit Date: 06/22/24  Subjective:    Patient ID: Barbara [name] , Female   DOB: 10-06-1955, 68 y.o.    MRN: [id number]   68 y.o. Female presents today for discussion of recent lab results.  She has noticed on recent lab results done on May 31, 2024 her hemoglobin was 11.3 g.  Her MCV is 76.2.  This is not different from other CBC results she has repeatedly demonstrated which we have on file up to 7 years ago.  She is anticipating orthopedic surgery in December and is concerned about her hemoglobin being low.  Explained to her she would be able to take iron supplement post surgery should Hemoglobin fall less than 10 g.  Postoperatively.  Otherwise I would not take any prophylactic iron medication at this point in time.  It will not help.  She was wondering if she should take an iron supplement due to her recent CBC results because she is having orthopedic surgery on December 2nd. She was informed that because she has Thalassemia trait, and not  iron deficiency , an iron supplement would not be helpful but it could be helpful to take a multi vitamin with iron around the time she is having the surgery as there may be some minor blood loss with the surgery.  06/18/2024 Labs Hemoglobin 11.0, MCV 75.4, MCH 22.6, MCHC 30.0, Otherwise WNL.    Vaccine counseling: Influenza  and Covid-19 due vaccine.  Past Medical History:  Diagnosis Date   Allergy    allergic rhinitis   Anemia    Arthritis    l k nee osteo   Diabetes mellitus without complication (HCC)    diet controlled   Hyperlipidemia    Leg swelling    a. RLE edema, intermittent, 2 negative duplexes in 2015.   Low back pain    Obesity      Family History  Problem Relation Age of Onset   Hypertension Mother    Stroke Mother    COPD Father    Diabetes Brother    Asthma Other    Hypertension Other    Hyperlipidemia Other    Stroke Other     Social  History   Social History Narrative   Social history: Does not smoke or consume alcohol.  Resides alone.  Has a college degree.  Has worked at [employer] for over 30 years.  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       Family history: Father deceased with history of asthma.  Mother with history of CVA and hypertension.  1 brother and 2 sisters in good health.          Review of Systems  All other systems reviewed and are negative.       Objective:   Vitals: Ht 5' 5 (1.651 m)   Wt 241 lb (109.3 kg)   BMI 40.10 kg/m    Physical Exam Vitals and nursing note reviewed.     Her chest is clear.  Cardiac exam: Regular rate and rhythm without murmur or ectopy.  No lower extremity pitting edema.  Neurologically she is intact without gross focal deficits.  She is ambulatory.  She is alert and oriented x 3 without gross focal deficits.  Results:   Labs:       Component Value Date/Time   NA 141 05/31/2024 0914   K 4.4 05/31/2024 0914   CL 105  05/31/2024 0914   CO2 27 05/31/2024 0914   GLUCOSE 92 05/31/2024 0914   BUN 15 05/31/2024 0914   CREATININE 0.73 05/31/2024 0914   CALCIUM  9.3 05/31/2024 0914   PROT 7.3 05/31/2024 0914   ALBUMIN 4.2 04/20/2015 0905   AST 16 05/31/2024 0914   ALT 10 05/31/2024 0914   ALKPHOS 82 04/20/2015 0905   BILITOT 0.4 05/31/2024 0914   GFRNONAA 84 06/22/2020 1105   GFRAA 97 06/22/2020 1105     Lab Results  Component Value Date   WBC 6.0 06/18/2024   HGB 11.0 (L) 06/18/2024   HCT 36.7 06/18/2024   MCV 75.4 (L) 06/18/2024   PLT 295 06/18/2024    Lab Results  Component Value Date   CHOL 177 05/31/2024   HDL 45 (L) 05/31/2024   LDLCALC 113 (H) 05/31/2024   TRIG 86 05/31/2024   CHOLHDL 3.9 05/31/2024    Lab Results  Component Value Date   HGBA1C 5.9 (H) 05/31/2024     Lab Results  Component Value Date   TSH 2.68 05/31/2024        Assessment & Plan:   Orders Placed This Encounter  Procedures   Ambulatory referral to  Hematology / Oncology    Referral Priority:   Urgent    Referral Type:   Consultation    Referral Reason:   Specialty Services Required    Requested Specialty:   Oncology    Number of Visits Requested:   1    Microcytosis: She was wondering if she should take an iron supplement due to her recent CBC results because she is having orthopedic surgery on December 2nd.   She was informed that because she has Thalassemia trait, and not  Iron deficiency , an iron supplement would not be helpful but it could be helpful to take a multi vitamin with iron around the time she is having the surgery due to minor blood loss with knee replacement surgery but not for an extended period of time- maybe 2 to 4 weeks.  Recent 06/18/2024 Labs show hemoglobin 11.0, MCV 75.4, MCH 22.6, MCHC 30.0, Otherwise WNL.   Referred to Hematology which I think would be helpful to her and understanding microcytosis.  She is worried about cancer.   Vaccine counseling: Influenza and Covid-19 due vaccine.    I,Makayla C Reid,acting as a scribe for Ronal JINNY Hailstone, MD.,have documented all relevant documentation on the behalf of Ronal JINNY Hailstone, MD,as directed by  Ronal JINNY Hailstone, MD while in the presence of Ronal JINNY Hailstone, MD.

## 2024-06-22 ENCOUNTER — Encounter: Payer: Self-pay | Admitting: Internal Medicine

## 2024-06-22 ENCOUNTER — Ambulatory Visit: Admitting: Internal Medicine

## 2024-06-22 VITALS — Ht 65.0 in | Wt 241.0 lb

## 2024-06-22 DIAGNOSIS — M1712 Unilateral primary osteoarthritis, left knee: Secondary | ICD-10-CM

## 2024-06-22 DIAGNOSIS — R718 Other abnormality of red blood cells: Secondary | ICD-10-CM | POA: Diagnosis not present

## 2024-06-22 DIAGNOSIS — I10 Essential (primary) hypertension: Secondary | ICD-10-CM

## 2024-06-22 DIAGNOSIS — D563 Thalassemia minor: Secondary | ICD-10-CM

## 2024-06-30 ENCOUNTER — Telehealth: Payer: Self-pay

## 2024-06-30 NOTE — Patient Instructions (Addendum)
 Patient is concerned about her Hemoglobin of 11 g.  She has a MCV of 75.4 which I think is consistent with thalassemia trait.  Recent anemia studies were drawn and her iron level was 82.  It might be helpful for her to take a multivitamin with iron around the time of her surgery due to blood loss anticipated with surgery but has had a similar hemoglobin noted here for many years.  However, I think she is concerned about cancer and would like to be referred to hematology.

## 2024-06-30 NOTE — Telephone Encounter (Signed)
 Spoke with patient and confirmed appointment on 11/20

## 2024-07-01 ENCOUNTER — Inpatient Hospital Stay: Admitting: Hematology and Oncology

## 2024-07-01 ENCOUNTER — Inpatient Hospital Stay

## 2024-07-12 LAB — HM DIABETES EYE EXAM

## 2024-07-16 ENCOUNTER — Encounter: Payer: Self-pay | Admitting: Internal Medicine

## 2024-07-27 ENCOUNTER — Inpatient Hospital Stay: Admitting: Hematology and Oncology

## 2024-07-27 ENCOUNTER — Inpatient Hospital Stay

## 2024-08-17 ENCOUNTER — Encounter: Payer: Self-pay | Admitting: Internal Medicine

## 2024-08-17 ENCOUNTER — Ambulatory Visit: Admitting: Internal Medicine

## 2024-08-17 VITALS — BP 120/80 | HR 80 | Temp 98.2°F | Ht 65.0 in | Wt 241.0 lb

## 2024-08-17 DIAGNOSIS — J22 Unspecified acute lower respiratory infection: Secondary | ICD-10-CM

## 2024-08-17 MED ORDER — HYDROCODONE BIT-HOMATROP MBR 5-1.5 MG/5ML PO SOLN: 5.0000 mL | Freq: Three times a day (TID) | ORAL | 0 refills | Status: DC | PRN

## 2024-08-17 MED ORDER — LEVOFLOXACIN 500 MG PO TABS: 500.0000 mg | ORAL_TABLET | Freq: Every day | ORAL | 0 refills | Status: AC

## 2024-08-17 NOTE — Progress Notes (Signed)
 "   Patient Care Team: Perri Ronal PARAS, MD as PCP - General (Internal Medicine)  Visit Date: 08/17/2024  Subjective:    Patient ID: Barbara [name] , Female   DOB: [birth year], 69 y.o.    MRN: [id number]   69 y.o. Female presents today for evaluation of  cough. Patient has a Past Medical history of Hyperlipidemia, Diabetes Mellitus, Vitamin D  deficiency.   Recently had left knee arthroplasty on 07/13/2024. Has been going to PT.  Diabetic eye exam done on 07/12/2024.  Barbara [name] has a cough that started Wednesday of last week. Barbara [name] denies being around anyone that was ill  but Barbara [name] did say that went to a casino on December 24 in [place], [place] and developed a cough about a week later. Barbara [name] went to the gynecologist where Barbara [name] was prescribed antibiotics for a separate issue.  Barbara [name] thinks that the cough might have lessened with that prescription. Cannot tell us  the name of that antibiotic.  S/p total arthroplasty of the left knee. Barbara [name] says that her knee is doing well.   Past Medical History:  Diagnosis Date   Allergy    allergic rhinitis   Anemia    Arthritis    l k nee osteo   Diabetes mellitus without complication (HCC)    diet controlled   Hyperlipidemia    Leg swelling    a. RLE edema, intermittent, 2 negative duplexes in 2015.   Low back pain    Obesity      Family History  Problem Relation Age of Onset   Hypertension Mother    Stroke Mother    COPD Father    Diabetes Brother    Asthma Other    Hypertension Other    Hyperlipidemia Other    Stroke Other     Social History   Social History Narrative   Social history: Does not smoke or consume alcohol.  Resides alone.  Has a college degree.  Has worked at [employer] for over 30 years.  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       Family history: Father deceased with history of asthma.  Mother with history of CVA and hypertension.  1 brother and 2 sisters in good health.          Review of Systems  HENT:  Positive for  congestion.   Respiratory:  Positive for cough and sputum production.         Objective:   Vitals: BP 120/80   Pulse 80   Temp 98.2 F (36.8 C)   Ht 5' 5 (1.651 m)   Wt 241 lb (109.3 kg)   SpO2 98%   BMI 40.10 kg/m    Physical Exam Vitals and nursing note reviewed.  Constitutional:      General: Barbara [name] is not in acute distress.    Appearance: Normal appearance. Barbara [name] is not ill-appearing.  HENT:     Head: Normocephalic and atraumatic.     Right Ear: Tympanic membrane, ear canal and external ear normal.     Left Ear: Tympanic membrane, ear canal and external ear normal.     Mouth/Throat:     Mouth: Mucous membranes are moist.     Pharynx: Oropharynx is clear. Posterior oropharyngeal erythema present. No oropharyngeal exudate.  Pulmonary:     Effort: Pulmonary effort is normal.     Breath sounds: Normal breath sounds. No wheezing, rhonchi or rales.  Lymphadenopathy:     Cervical: No cervical adenopathy.  Skin:  General: Skin is warm and dry.  Neurological:     Mental Status: Barbara [name] is alert and oriented to person, place, and time. Mental status is at baseline.  Psychiatric:        Mood and Affect: Mood normal.        Behavior: Behavior normal.        Thought Content: Thought content normal.        Judgment: Judgment normal.       Results:    Labs:       Component Value Date/Time   NA 141 05/31/2024 0914   K 4.4 05/31/2024 0914   CL 105 05/31/2024 0914   CO2 27 05/31/2024 0914   GLUCOSE 92 05/31/2024 0914   BUN 15 05/31/2024 0914   CREATININE 0.73 05/31/2024 0914   CALCIUM  9.3 05/31/2024 0914   PROT 7.3 05/31/2024 0914   ALBUMIN 4.2 04/20/2015 0905   AST 16 05/31/2024 0914   ALT 10 05/31/2024 0914   ALKPHOS 82 04/20/2015 0905   BILITOT 0.4 05/31/2024 0914   GFRNONAA 84 06/22/2020 1105   GFRAA 97 06/22/2020 1105     Lab Results  Component Value Date   WBC 6.0 06/18/2024   HGB 11.0 (L) 06/18/2024   HCT 36.7 06/18/2024   MCV 75.4 (L) 06/18/2024    PLT 295 06/18/2024    Lab Results  Component Value Date   CHOL 177 05/31/2024   HDL 45 (L) 05/31/2024   LDLCALC 113 (H) 05/31/2024   TRIG 86 05/31/2024   CHOLHDL 3.9 05/31/2024    Lab Results  Component Value Date   HGBA1C 5.9 (H) 05/31/2024     Lab Results  Component Value Date   TSH 2.68 05/31/2024        Assessment & Plan:   Meds ordered this encounter  Medications   levofloxacin  (LEVAQUIN ) 500 MG tablet    Sig: Take 1 tablet (500 mg total) by mouth daily for 7 days.    Dispense:  7 tablet    Refill:  0   HYDROcodone  bit-homatropine (HYCODAN) 5-1.5 MG/5ML syrup    Sig: Take 5 mLs by mouth every 8 (eight) hours as needed for cough.    Dispense:  120 mL    Refill:  0    Acute lower respiratory infection: Barbara [name] has a cough that started Wednesday of last week. Barbara [name] denies being around anyone that was sick but Barbara [name] did say that went to a casino on December 24 and developed a cough about a week later. Barbara [name] went to the gynecologist where Barbara [name] was prescribed antibiotics for a separate issues and Barbara [name] said that the cough might have lessened.   Levaquin  500 mg daily prescribed.   Hycodan 5 mL every 8 hours as needed for cough prescribed.   S/p total arthroplasty of the left knee. Barbara [name] says that her knee is doing well.   I,Makayla C Reid,acting as a scribe for Ronal JINNY Hailstone, MD.,have documented all relevant documentation on the behalf of Ronal JINNY Hailstone, MD,as directed by  Ronal JINNY Hailstone, MD while in the presence of Ronal JINNY Hailstone, MD.  I, Ronal JINNY Hailstone, MD, have reviewed all documentation for this visit. The documentation on 08/17/2024 for the exam, diagnosis, procedures, and orders are all accurate and complete.    "

## 2024-08-25 ENCOUNTER — Encounter: Payer: Self-pay | Admitting: Internal Medicine

## 2024-08-25 NOTE — Patient Instructions (Addendum)
 You have been prescribed Levaquin  500 milligrams daily for 7 days and Hycodan 1 teaspoon every 8 hours as needed for cough.  Rest and stay well-hydrated.  Call if not better in 7 to 10 days or sooner if worse.

## 2024-09-02 ENCOUNTER — Ambulatory Visit: Admitting: Internal Medicine

## 2024-09-02 ENCOUNTER — Encounter: Payer: Self-pay | Admitting: Internal Medicine

## 2024-09-02 VITALS — BP 100/80 | HR 88 | Temp 98.2°F | Ht 65.0 in | Wt 241.0 lb

## 2024-09-02 DIAGNOSIS — L509 Urticaria, unspecified: Secondary | ICD-10-CM

## 2024-09-02 DIAGNOSIS — L232 Allergic contact dermatitis due to cosmetics: Secondary | ICD-10-CM

## 2024-09-02 MED ORDER — METHYLPREDNISOLONE ACETATE 80 MG/ML IJ SUSP
80.0000 mg | Freq: Once | INTRAMUSCULAR | Status: AC
  Administered 2024-09-02: 80 mg via INTRAMUSCULAR

## 2024-09-02 MED ORDER — HYDROXYZINE PAMOATE 25 MG PO CAPS: ORAL_CAPSULE | ORAL | 1 refills | Status: AC

## 2024-09-02 NOTE — Progress Notes (Signed)
 "   Patient Care Team: Perri Ronal PARAS, MD as PCP - General (Internal Medicine)  Visit Date: 09/02/24  Subjective:    Patient ID: Barbara [name] , Female   DOB: 1955/10/18, 69 y.o.    MRN: [id number]   69 y.o. Female presents today for Rash. Patient has a past medical history of Diabetes Mellitus, Hyperlipidemia, Hypertension.  She got her hair braided and on Sunday she noticed that her scalp was itching. Subsequently developed a rash on her neck and face. She also says that she has noticed raised lesions  on her arms. She has since taken out the braiding hair. She took Benadryl yesterday and she says that the itching improved. She said that up until 1 pm today rash was visible but it is less visible now.    Past Medical History:  Diagnosis Date   Allergy    allergic rhinitis   Anemia    Arthritis    l k nee osteo   Diabetes mellitus without complication (HCC)    diet controlled   Hyperlipidemia    Leg swelling    a. RLE edema, intermittent, 2 negative duplexes in 2015.   Low back pain    Obesity      Family History  Problem Relation Age of Onset   Hypertension Mother    Stroke Mother    COPD Father    Diabetes Brother    Asthma Other    Hypertension Other    Hyperlipidemia Other    Stroke Other     Social History   Social History Narrative   Social history: Does not smoke or consume alcohol.  Resides alone.  Has a college degree.  Has worked at [employer] for over 30 years.  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       Family history: Father deceased with history of asthma.  Mother with history of CVA and hypertension.  1 brother and 2 sisters in good health.          Review of Systems  Skin:  Positive for itching and rash.        Objective:   Vitals: BP 100/80 (BP Location: Left Arm, Patient Position: Sitting)   Pulse 88   Temp 98.2 F (36.8 C)   Ht 5' 5 (1.651 m)   Wt 241 lb (109.3 kg)   SpO2 98%   BMI 40.10 kg/m    Physical Exam Vitals  and nursing note reviewed.  Skin:    Findings: Rash present. Rash is urticarial.     Rash affects arms and scalp.  Results:   Studies obtained and personally reviewed by me:  Labs:       Component Value Date/Time   [name] 141 05/31/2024 0914   K 4.4 05/31/2024 0914   CL 105 05/31/2024 0914   CO2 27 05/31/2024 0914   GLUCOSE 92 05/31/2024 0914   BUN 15 05/31/2024 0914   CREATININE 0.73 05/31/2024 0914   CALCIUM  9.3 05/31/2024 0914   PROT 7.3 05/31/2024 0914   ALBUMIN 4.2 04/20/2015 0905   AST 16 05/31/2024 0914   ALT 10 05/31/2024 0914   ALKPHOS 82 04/20/2015 0905   BILITOT 0.4 05/31/2024 0914   GFRNONAA 84 06/22/2020 1105   GFRAA 97 06/22/2020 1105     Lab Results  Component Value Date   WBC 6.0 06/18/2024   HGB 11.0 (L) 06/18/2024   HCT 36.7 06/18/2024   MCV 75.4 (L) 06/18/2024   PLT 295 06/18/2024  Lab Results  Component Value Date   CHOL 177 05/31/2024   HDL 45 (L) 05/31/2024   LDLCALC 113 (H) 05/31/2024   TRIG 86 05/31/2024   CHOLHDL 3.9 05/31/2024    Lab Results  Component Value Date   HGBA1C 5.9 (H) 05/31/2024     Lab Results  Component Value Date   TSH 2.68 05/31/2024        Assessment & Plan:   Meds ordered this encounter  Medications   hydrOXYzine  (VISTARIL ) 25 MG capsule    Sig: 0ne or two capsules by mouth up to 3 times a day as needed for itching    Dispense:  30 capsule    Refill:  1    Urticaria: She had her hair braided and subsequently she noticed that her scalp was itching and she had a rash on her neck and face. She also says that she has raised areas on her arms. She has since removed the braiding hair. She took Benadryl yesterday and she said that the itching improved. She said that up until 1 pm today you could see the rash but it is less visible now.     Depo-medrol  80 mg IM injection received today.   Atarax  25 mg one or two capsules up to 3 times a day as needed for itching     I,Makayla C Reid,acting as a scribe for  Ronal JINNY Hailstone, MD.,have documented all relevant documentation on the behalf of Ronal JINNY Hailstone, MD,as directed by  Ronal JINNY Hailstone, MD while in the presence of Ronal JINNY Hailstone, MD.   I, Ronal JINNY Hailstone, MD, have reviewed all documentation for this visit. The documentation on 09/02/2024 for the exam, diagnosis, procedures, and orders are all accurate and complete.    "

## 2024-09-07 NOTE — Patient Instructions (Addendum)
 Suspect contact dermatitis to hair products. Depomedrol 80 mg IM given in office.Take Atarax  25 mg capsules- one or two capsules up to 3 times daily as needed for itching.
